# Patient Record
Sex: Male | Born: 1985 | Race: White | Hispanic: No | Marital: Married | Smoking: Never smoker
Health system: Southern US, Community
[De-identification: ages and names within clinical notes are randomized; demographics above are authoritative.]

## PROBLEM LIST (undated history)

## (undated) DIAGNOSIS — Z87898 Personal history of other specified conditions: Secondary | ICD-10-CM

## (undated) DIAGNOSIS — I10 Essential (primary) hypertension: Secondary | ICD-10-CM

## (undated) DIAGNOSIS — Z87442 Personal history of urinary calculi: Secondary | ICD-10-CM

## (undated) DIAGNOSIS — L0390 Cellulitis, unspecified: Secondary | ICD-10-CM

## (undated) HISTORY — PX: KNEE ARTHROTOMY: SHX993

## (undated) HISTORY — PX: URETHRAL DILATION: SUR417

## (undated) HISTORY — DX: Essential (primary) hypertension: I10

---
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
## 2010-08-11 ENCOUNTER — Emergency Department (HOSPITAL_COMMUNITY)
Admission: EM | Admit: 2010-08-11 | Discharge: 2010-08-11 | Payer: Self-pay | Source: Home / Self Care | Admitting: Emergency Medicine

## 2011-01-11 NOTE — Op Note (Signed)
NAME:  Eugene Love, Eugene Love                          ACCOUNT NO.:  [id number]   MEDICAL RECORD NO.:  [id number]                   PATIENT TYPE:  AMB   LOCATION:  DAY                                  FACILITY:  [facility]   PHYSICIAN:  Jene Every, M.D.                 DATE OF BIRTH:  24-Aug-1986   DATE OF PROCEDURE:  08/17/2003  DATE OF DISCHARGE:                                 OPERATIVE REPORT   PREOPERATIVE DIAGNOSES:  Medial meniscus tear bucket-handle right knee.   POSTOPERATIVE DIAGNOSES:  Medial meniscus tear bucket-handle right knee.   PROCEDURE PERFORMED:  Right knee arthroscopy, partial medial meniscectomy.   ANESTHESIA:  General.   ASSISTANT:  None.   BRIEF HISTORY:  A 25 year old with a knee injury and mechanical symptoms  locking, popping and giving way.  MRI indicated a meniscal tear. Operative  intervention was indicated for evaluation and partial medial meniscectomy.  The risks and benefits were discussed including bleeding, infection, injury  to neurovascular structures, recurrent tear, need for repair in the future,  etc.   TECHNIQUE:  The patient in the supine position and after an adequate level  of general anesthesia and 1 gram of Kefzol, the right lower extremity was  prepped and draped in the usual sterile fashion. First a lateral  parapatellar portal was fashioned with a #11 blade in the usual fashion and  a superomedial parapatellar portal was fashioned with a #11 blade. The  ingress cannula atraumatically placed, irrigant was utilized to insufflate  the joint.  Arthroscopic camera then inserted via the lateral portal and  inspection of the suprapatellar pouch revealed normal patellofemoral  tracking, normal patellofemoral sulcus.  Examination of the intercondylar  notch was obscured by a displaced apparent bucket-handle type tear of the  meniscus. An 18 gauge needle was utilized to localize the medial  parapatellar portal which was fashioned with a #11 blade  after localization  with an 18 gauge needle sparing the remnant of the medial meniscus.  We  inserted a probe and inspected the meniscus. This is a fairly significant  tear complex in nature in that it extended from the posterior aspect of the  medial meniscus to the anterior lateral aspect of the meniscus. It, however,  was a complex tear in that it involved the upper portion of the cephalad  portion of the medial meniscus.  There was cleavage plane that tore through  approximately the upper half of the meniscus in the inner one-half.  There  was a partial extension out into the junction of the middle and out to the  junction of the outer third.  The meniscal fragment showed multiple tears  and multiple cleavage planes and it was near truncated in its anterior  margin attached by only a very small stalk as it was posteriorly. There were  multiple cleavage and tear planes in the posterior portion of the meniscus  as  well.  It was felt that this again was not in the fascia and therefore  not a repairable meniscus.  I removed the anterior attachment with a rongeur  and contoured with a 3.5 Kuda shaver.  I removed the meniscal fragment in  multiple pieces due to its multiple complex tearing.  Attached the posterior  attachment of it as well. The remnant meniscus was stable to propalpation  and interestingly along the posterior portion from approximately 2 o'clock  to the 5 o'clock position the cleavage plane was noted. I contoured the  medial edge.  Again 50% of the meniscus remained intact and stable to  propalpation.  I felt that resecting this was not in his best interest as  the remnants did appear to be a stable plane.  Though my concern ultimately  would be that if it tore at this junction, significant portions of meniscus  would need to be removed.  There was one area at this anterior medial  junction that I inserted an 18 gauge needle through the portal and through  the skin over the  medial parapatellar portal just to _________ this area in  hopes that it would stimulate a good vascular response at this one extended  area to allow for perhaps consolidation of that junction to strengthen the  junction between the cleavage plane of the meniscus. I flexed and extended  the knee and there was no impingement of the knee over this for the  remaining meniscal fragment with hopes that this would still provide him  with some cushioning without some cushioning for the condyle in the tibial  plateau.  I then examined the ACL and PCL and they were unremarkable,  examined the lateral compartment, normal femoral condyle, lateral meniscus  and tibial plateau stable to propalpation without evidence of a tear. The  wound was copiously lavaged.  I examined the gutters and they were  unremarkable as well and were palpated posteriorly with no residual  fragments noted.  The knee was copiously flushed.  I closed the portals with  2-0 nylon simple sutures, 0.25% Marcaine with epinephrine and __________.  The wound was dressed sterilely.  He was awoken without difficulty and  transported to the recovery room in satisfactory condition. He had full  range of motion without ___________.  Just prior to full removal, he did  have some grade 1-2 changes in the medial tibial plateau.  There was some  grade 2 changes and some minor grade 3 changes along the femoral condyle.  The wound was dressed sterilely, he was awoken without difficulty and  transported to the recovery room in satisfactory condition.   The patient tolerated the procedure well with no complications.                                               Jene Every, M.D.    Cordelia Pen  D:  08/17/2003  T:  08/17/2003  Job:  [id number]

## 2012-04-27 ENCOUNTER — Encounter (HOSPITAL_COMMUNITY): Payer: Self-pay | Admitting: *Deleted

## 2012-04-27 ENCOUNTER — Emergency Department (HOSPITAL_COMMUNITY)
Admission: EM | Admit: 2012-04-27 | Discharge: 2012-04-27 | Disposition: A | Discharge status: Home / Self Care | Payer: BC Managed Care – PPO | Attending: Emergency Medicine | Admitting: Emergency Medicine

## 2012-04-27 DIAGNOSIS — B369 Superficial mycosis, unspecified: Secondary | ICD-10-CM | POA: Insufficient documentation

## 2012-04-27 DIAGNOSIS — H6240 Otitis externa in other diseases classified elsewhere, unspecified ear: Secondary | ICD-10-CM

## 2012-04-27 NOTE — ED Notes (Signed)
Pt has had an left ear infection since July has had both oral antibiotics and Fungal meds for problem, pain is worse today

## 2012-04-27 NOTE — ED Provider Notes (Signed)
History     CSN: [id number]  Arrival date & time 04/27/12  1548   First MD Initiated Contact with Patient 04/27/12 1616      Chief Complaint  Patient presents with  . Otalgia    (Consider location/radiation/quality/duration/timing/severity/associated sxs/prior treatment) HPI Comments: Patient presents today with a chief complaint of left ear pain.  He states that the pain has been present for the past 2 months.  He was initially seen by his PCP and diagnosed with AOE and given antibiotic drops.  His pain did not improve.  He then followed up with ENT and was diagnosed with a fungal OE.  He has been given oral antifungals and antifungal ear drops.  His pain did not improve.  Three days ago ENT had switched his antifungal to tolnaftate solution, which he has been taking.  He does not feel that his symptoms are improving, but feels that symptoms are worsening.  He denies fever or chills.  No nausea or vomiting.  No surrounding erythema of the ears.  He has not noticed any recent drainage from the ears.  He does think that his hearing is decreased in his left ear.  Patient is a 26 y.o. male presenting with ear pain. The history is provided by the patient.  Otalgia There has been no fever. Pertinent negatives include no ear discharge, no headaches, no sore throat and no neck pain. Associated symptoms comments: Decreased hearing.    History reviewed. No pertinent past medical history.  Past Surgical History  Procedure Date  . Knee arthrotomy     No family history on file.  History  Substance Use Topics  . Smoking status: Never Smoker   . Smokeless tobacco: Not on file  . Alcohol Use: Yes      Review of Systems  Constitutional: Negative for fever and chills.  HENT: Positive for ear pain. Negative for sore throat, trouble swallowing, neck pain, neck stiffness, tinnitus and ear discharge.   Skin: Negative for color change.  Neurological: Negative for headaches.    Allergies    Review of patient's allergies indicates no known allergies.  Home Medications   Current Outpatient Rx  Name Route Sig Dispense Refill  . IBUPROFEN 200 MG PO TABS Oral Take 200 mg by mouth every 6 (six) hours as needed. Pain    . TOLNAFTATE 1 % EX SOLN Topical Apply 1 application topically 2 (two) times daily. 5 gtts to affected ear BID      BP 135/85  Pulse 77  Temp 97.9 F (36.6 C) (Oral)  SpO2 96%  Physical Exam  Nursing note and vitals reviewed. Constitutional: He appears well-developed and well-nourished. No distress.  HENT:  Head: Normocephalic and atraumatic.  Right Ear: Hearing, tympanic membrane, external ear and ear canal normal. No drainage. Tympanic membrane is not erythematous.  Left Ear: External ear normal. No drainage. Tympanic membrane is not erythematous.  Mouth/Throat: Oropharynx is clear and moist.       Fungal infection of the left EAC No mastoid erythema or tenderness  Neck: Normal range of motion. Neck supple.  Cardiovascular: Normal rate, regular rhythm and normal heart sounds.   Pulmonary/Chest: Effort normal and breath sounds normal.  Neurological: He is alert.  Skin: Skin is warm and dry. He is not diaphoretic. No erythema.  Psychiatric: He has a normal mood and affect.    ED Course  Procedures (including critical care time)  Labs Reviewed - No data to display No results found.   No  diagnosis found.    MDM  Patient presents today with ear pain.  He has been evaluated by ENT and diagnosed with a fungal infection of the ear.  He is currently on tolnaftate, but does not feel that it is helping.  Patient is afebrile.  No systemic symptoms.  Therefore, patient instructed to continue taking the medication and follow up with ENT.        Pascal Lux [place], PA-C 04/28/12 1231

## 2012-04-29 NOTE — ED Provider Notes (Signed)
Medical screening examination/treatment/procedure(s) were performed by non-physician practitioner and as supervising physician I was immediately available for consultation/collaboration.    Suha Schoenbeck R Enzio Buchler, MD 04/29/12 1100 

## 2014-04-06 ENCOUNTER — Encounter: Payer: Self-pay | Admitting: Nurse Practitioner

## 2014-04-06 ENCOUNTER — Ambulatory Visit (INDEPENDENT_AMBULATORY_CARE_PROVIDER_SITE_OTHER): Payer: BC Managed Care – PPO | Admitting: Nurse Practitioner

## 2014-04-06 ENCOUNTER — Encounter (INDEPENDENT_AMBULATORY_CARE_PROVIDER_SITE_OTHER): Payer: Self-pay

## 2014-04-06 VITALS — BP 145/92 | HR 83 | Temp 98.0°F | Ht 70.5 in | Wt 249.6 lb

## 2014-04-06 DIAGNOSIS — J209 Acute bronchitis, unspecified: Secondary | ICD-10-CM

## 2014-04-06 MED ORDER — PREDNISONE 20 MG PO TABS: ORAL_TABLET | ORAL | Status: DC

## 2014-04-06 MED ORDER — AZITHROMYCIN 250 MG PO TABS: ORAL_TABLET | ORAL | Status: DC

## 2014-04-06 MED ORDER — HYDROCODONE-HOMATROPINE 5-1.5 MG/5ML PO SYRP: 5.0000 mL | ORAL_SOLUTION | Freq: Three times a day (TID) | ORAL | Status: DC | PRN

## 2014-04-06 NOTE — Patient Instructions (Signed)

## 2014-04-06 NOTE — Progress Notes (Signed)
Subjective:    Patient ID: Eugene BealsRyan Beagley, male    DOB: 06/20/1986, 28 y.o.   MRN: [id number]  HPI Patient in c/o cough that started 2 nights ago- has progressed into a deep cough with sore throat and congestion.    Review of Systems  Constitutional: Positive for fatigue. Negative for fever, chills and appetite change.  HENT: Positive for congestion, ear pain, postnasal drip, rhinorrhea, sinus pressure and sore throat.   Respiratory: Cough: occasionally productive.   Cardiovascular: Negative.   Gastrointestinal: Negative.   Genitourinary: Negative.   Psychiatric/Behavioral: Negative.   All other systems reviewed and are negative.      Objective:   Physical Exam  Constitutional: He is oriented to person, place, and time. He appears well-developed and well-nourished.  HENT:  Right Ear: Hearing, tympanic membrane, external ear and ear canal normal.  Left Ear: Hearing, tympanic membrane, external ear and ear canal normal.  Nose: Mucosal edema and rhinorrhea present. Right sinus exhibits no maxillary sinus tenderness and no frontal sinus tenderness. Left sinus exhibits no maxillary sinus tenderness and no frontal sinus tenderness.  Mouth/Throat: Uvula is midline. Posterior oropharyngeal erythema (mild) present.  Neck: Normal range of motion. No thyromegaly present.  Cardiovascular: Normal rate, regular rhythm and normal heart sounds.   Pulmonary/Chest: Effort normal and breath sounds normal.  Deep tight cough  Abdominal: Soft. Bowel sounds are normal.  Musculoskeletal: Normal range of motion.  Lymphadenopathy:    He has no cervical adenopathy.  Neurological: He is alert and oriented to person, place, and time.  Skin: Skin is warm and dry.  Psychiatric: He has a normal mood and affect. His behavior is normal. Judgment and thought content normal.   BP 145/92  Pulse 83  Temp(Src) 98 F (36.7 C) (Oral)  Ht 5' 10.5" (1.791 m)  Wt 249 lb 9.6 oz (113.218 kg)  BMI 35.30  kg/m2        Assessment & Plan:   1. Acute bronchitis, unspecified organism    Meds ordered this encounter  Medications  . azithromycin (ZITHROMAX Z-PAK) 250 MG tablet    Sig: As directed    Dispense:  6 each    Refill:  0    Order Specific Question:  Supervising Provider    Answer:  Ernestina PennaMOORE, DONALD W [[id number]]  . predniSONE (DELTASONE) 20 MG tablet    Sig: 2 po at same time daily X 5 days    Dispense:  10 tablet    Refill:  0    Order Specific Question:  Supervising Provider    Answer:  Ernestina PennaMOORE, DONALD W [[id number]]  . HYDROcodone-homatropine (HYCODAN) 5-1.5 MG/5ML syrup    Sig: Take 5 mLs by mouth every 8 (eight) hours as needed for cough.    Dispense:  120 mL    Refill:  0    Order Specific Question:  Supervising Provider    Answer:  Ernestina PennaMOORE, DONALD W [[id number]]   1. Take meds as prescribed 2. Use a cool mist humidifier especially during the winter months and when heat has been humid. 3. Use saline nose sprays frequently 4. Saline irrigations of the nose can be very helpful if done frequently.  * 4X daily for 1 week*  * Use of a nettie pot can be helpful with this. Follow directions with this* 5. Drink plenty of fluids 6. Keep thermostat turn down low 7.For any cough or congestion  Use plain Mucinex- regular strength or max strength is fine   * Children- consult with  Pharmacist for dosing 8. For fever or aces or pains- take tylenol or ibuprofen appropriate for age and weight.  * for fevers greater than 101 orally you may alternate ibuprofen and tylenol every  3 hours.   Mary-Margaret Hassell Done, FNP

## 2014-04-19 ENCOUNTER — Telehealth: Payer: Self-pay | Admitting: Family Medicine

## 2014-04-19 NOTE — Telephone Encounter (Signed)
Pt notified and will try OTC med and will call back if no improvement.

## 2014-04-19 NOTE — Telephone Encounter (Signed)
Not much else can do- force fluids and try delsym OTC- NTBS if no better

## 2014-05-07 ENCOUNTER — Encounter: Payer: Self-pay | Admitting: Nurse Practitioner

## 2014-05-07 ENCOUNTER — Ambulatory Visit (INDEPENDENT_AMBULATORY_CARE_PROVIDER_SITE_OTHER): Payer: BC Managed Care – PPO | Admitting: Nurse Practitioner

## 2014-05-07 VITALS — BP 121/86 | HR 67 | Temp 97.6°F | Ht 70.5 in | Wt 247.6 lb

## 2014-05-07 DIAGNOSIS — J209 Acute bronchitis, unspecified: Secondary | ICD-10-CM

## 2014-05-07 MED ORDER — METHYLPREDNISOLONE ACETATE 80 MG/ML IJ SUSP
80.0000 mg | Freq: Once | INTRAMUSCULAR | Status: AC
  Administered 2014-05-07: 80 mg via INTRAMUSCULAR

## 2014-05-07 MED ORDER — AMOXICILLIN 875 MG PO TABS: 875.0000 mg | ORAL_TABLET | Freq: Two times a day (BID) | ORAL | Status: DC

## 2014-05-07 NOTE — Patient Instructions (Signed)

## 2014-05-07 NOTE — Progress Notes (Signed)
   Subjective:    Patient ID: Eugene Love, male    DOB: 05-10-1986, 28 y.o.   MRN: [id number]  HPI Patient in c/o cough and congestion- was seen 1 month ago with same symptoms that never completely resolved. Cough has gotten worsed- mild congestion.    Review of Systems  Constitutional: Negative for fever, chills and appetite change.  HENT: Positive for congestion, rhinorrhea and sore throat (in mornings only).   Respiratory: Positive for cough.   Cardiovascular: Negative.   Neurological: Negative.   Psychiatric/Behavioral: Negative.   All other systems reviewed and are negative.      Objective:   Physical Exam  Constitutional: He is oriented to person, place, and time. He appears well-developed and well-nourished. No distress.  HENT:  Right Ear: Hearing, tympanic membrane, external ear and ear canal normal.  Left Ear: Hearing, tympanic membrane, external ear and ear canal normal.  Nose: Mucosal edema and rhinorrhea present. Right sinus exhibits no maxillary sinus tenderness and no frontal sinus tenderness. Left sinus exhibits no maxillary sinus tenderness and no frontal sinus tenderness.  Mouth/Throat: Uvula is midline, oropharynx is clear and moist and mucous membranes are normal.  Eyes: Pupils are equal, round, and reactive to light.  Neck: Normal range of motion. Neck supple.  Cardiovascular: Normal rate, regular rhythm and normal heart sounds.   Pulmonary/Chest: Effort normal and breath sounds normal.  Deep dry cough  Neurological: He is alert and oriented to person, place, and time.  Skin: Skin is warm and dry.  Psychiatric: He has a normal mood and affect. His behavior is normal. Judgment and thought content normal.    BP 121/86  Pulse 67  Temp(Src) 97.6 F (36.4 C) (Oral)  Ht 5' 10.5" (1.791 m)  Wt 247 lb 9.6 oz (112.311 kg)  BMI 35.01 kg/m2       Assessment & Plan:   1. Acute bronchitis, unspecified organism    Meds ordered this encounter  Medications  .  amoxicillin (AMOXIL) 875 MG tablet    Sig: Take 1 tablet (875 mg total) by mouth 2 (two) times daily.    Dispense:  20 tablet    Refill:  0    Order Specific Question:  Supervising Provider    Answer:  Ernestina Penna [[id number]]  . methylPREDNISolone acetate (DEPO-MEDROL) injection 80 mg    Sig:    1. Take meds as prescribed 2. Use a cool mist humidifier especially during the winter months and when heat has been humid. 3. Use saline nose sprays frequently 4. Saline irrigations of the nose can be very helpful if done frequently.  * 4X daily for 1 week*  * Use of a nettie pot can be helpful with this. Follow directions with this* 5. Drink plenty of fluids 6. Keep thermostat turn down low 7.For any cough or congestion  Use plain Mucinex- regular strength or max strength is fine   * Children- consult with Pharmacist for dosing 8. For fever or aces or pains- take tylenol or ibuprofen appropriate for age and weight.  * for fevers greater than 101 orally you may alternate ibuprofen and tylenol every  3 hours.   Mary-Margaret Daphine Deutscher, FNP

## 2015-02-16 ENCOUNTER — Ambulatory Visit (INDEPENDENT_AMBULATORY_CARE_PROVIDER_SITE_OTHER): Payer: BC Managed Care – PPO | Admitting: Otolaryngology

## 2015-02-16 DIAGNOSIS — H6121 Impacted cerumen, right ear: Secondary | ICD-10-CM

## 2015-02-16 DIAGNOSIS — H60331 Swimmer's ear, right ear: Secondary | ICD-10-CM | POA: Diagnosis not present

## 2015-08-05 ENCOUNTER — Ambulatory Visit (INDEPENDENT_AMBULATORY_CARE_PROVIDER_SITE_OTHER): Payer: BC Managed Care – PPO | Admitting: Nurse Practitioner

## 2015-08-05 VITALS — BP 135/82 | HR 108 | Temp 98.8°F | Ht 70.5 in | Wt 263.4 lb

## 2015-08-05 DIAGNOSIS — J069 Acute upper respiratory infection, unspecified: Secondary | ICD-10-CM

## 2015-08-05 DIAGNOSIS — K122 Cellulitis and abscess of mouth: Secondary | ICD-10-CM | POA: Diagnosis not present

## 2015-08-05 DIAGNOSIS — J029 Acute pharyngitis, unspecified: Secondary | ICD-10-CM | POA: Diagnosis not present

## 2015-08-05 LAB — POCT RAPID STREP A (OFFICE): Rapid Strep A Screen: NEGATIVE

## 2015-08-05 MED ORDER — AMOXICILLIN 875 MG PO TABS: 875.0000 mg | ORAL_TABLET | Freq: Two times a day (BID) | ORAL | Status: DC

## 2015-08-05 MED ORDER — METHYLPREDNISOLONE ACETATE 80 MG/ML IJ SUSP
80.0000 mg | Freq: Once | INTRAMUSCULAR | Status: AC
  Administered 2015-08-05: 80 mg via INTRAMUSCULAR

## 2015-08-05 NOTE — Progress Notes (Signed)
  Subjective:     Eugene Love is a 29 y.o. male who presents for evaluation of sore throat. Symptoms include: congestion, cough, nasal congestion, sneezing and sore throat. Onset of symptoms was 3 days ago. Symptoms have been gradually worsening since that time. Past history is significant for no history of pneumonia or bronchitis. Patient is a non-smoker.  The following portions of the patient's history were reviewed and updated as appropriate: allergies, current medications, past family history, past medical history, past social history, past surgical history and problem list.  Review of Systems Pertinent items are noted in HPI.   Objective:    General appearance: alert and cooperative Eyes: conjunctivae/corneas clear. PERRL, EOM's intact. Fundi benign. Ears: normal TM's and external ear canals both ears Nose: clear discharge, moderate congestion, turbinates red, no sinus tenderness Throat: lips, mucosa, and tongue normal; teeth and gums normal and post oral pharynx erythematous with moderate inflammaton of uvula Neck: no adenopathy, no carotid bruit, no JVD, supple, symmetrical, trachea midline and thyroid not enlarged, symmetric, no tenderness/mass/nodules Lungs: clear to auscultation bilaterally Heart: regular rate and rhythm, S1, S2 normal, no murmur, click, rub or gallop   Results for orders placed or performed in visit on 08/05/15  POCT rapid strep A  Result Value Ref Range   Rapid Strep A Screen Negative Negative        Assessment:    Acute resp infection with cough and uvulitis  Plan:  1. Sore throat - POCT rapid strep A  2. Upper respiratory tract infection 1. Take meds as prescribed 2. Use a cool mist humidifier especially during the winter months and when heat has been humid. 3. Use saline nose sprays frequently 4. Saline irrigations of the nose can be very helpful if done frequently.  * 4X daily for 1 week*  * Use of a nettie pot can be helpful with this.  Follow directions with this* 5. Drink plenty of fluids 6. Keep thermostat turn down low 7.For any cough or congestion  Use plain Mucinex- regular strength or max strength is fine   * Children- consult with Pharmacist for dosing 8. For fever or aces or pains- take tylenol or ibuprofen appropriate for age and weight.  * for fevers greater than 101 orally you may alternate ibuprofen and tylenol every  3 hours.    - amoxicillin (AMOXIL) 875 MG tablet; Take 1 tablet (875 mg total) by mouth 2 (two) times daily. 1 po BID  Dispense: 20 tablet; Refill: 0  3. Uvulitis Force fluids Motrin or tylenol OTC OTC decongestant Throat lozenges if help New toothbrush in 3 days - methylPREDNISolone acetate (DEPO-MEDROL) injection 80 mg; Inject 1 mL (80 mg total) into the muscle once.  Eugene Daphine DeutscherMartin, FNP

## 2015-08-05 NOTE — Patient Instructions (Signed)

## 2015-08-29 ENCOUNTER — Telehealth: Payer: Self-pay | Admitting: Nurse Practitioner

## 2015-08-29 NOTE — Telephone Encounter (Signed)
denied °

## 2016-04-18 ENCOUNTER — Other Ambulatory Visit: Payer: Self-pay | Admitting: *Deleted

## 2016-04-18 NOTE — Telephone Encounter (Signed)
Last filled 02/20/16. Route to Pool A, so they can call med in

## 2016-04-19 MED ORDER — CLONAZEPAM 0.5 MG PO TABS: 0.5000 mg | ORAL_TABLET | Freq: Every evening | ORAL | 5 refills | Status: DC | PRN

## 2016-04-19 NOTE — Telephone Encounter (Signed)
rx called into pharmacy

## 2016-04-19 NOTE — Telephone Encounter (Signed)
I completed the fill, okay to call in

## 2016-04-23 ENCOUNTER — Telehealth: Payer: Self-pay | Admitting: Nurse Practitioner

## 2016-05-24 ENCOUNTER — Other Ambulatory Visit: Payer: Self-pay | Admitting: *Deleted

## 2016-05-24 ENCOUNTER — Other Ambulatory Visit: Payer: Self-pay | Admitting: Physician Assistant

## 2016-05-24 MED ORDER — CLONAZEPAM 0.5 MG PO TABS: 0.5000 mg | ORAL_TABLET | Freq: Every evening | ORAL | 3 refills | Status: DC | PRN

## 2016-05-24 NOTE — Telephone Encounter (Signed)
Rx called in to pharmacy. 

## 2016-06-18 ENCOUNTER — Ambulatory Visit (INDEPENDENT_AMBULATORY_CARE_PROVIDER_SITE_OTHER): Payer: BC Managed Care – PPO | Admitting: *Deleted

## 2016-06-18 DIAGNOSIS — Z23 Encounter for immunization: Secondary | ICD-10-CM | POA: Diagnosis not present

## 2016-06-18 NOTE — Progress Notes (Signed)
Pt given Tdap Tolerated well 

## 2016-07-05 ENCOUNTER — Ambulatory Visit (INDEPENDENT_AMBULATORY_CARE_PROVIDER_SITE_OTHER): Payer: BC Managed Care – PPO | Admitting: Pediatrics

## 2016-07-05 ENCOUNTER — Encounter: Payer: Self-pay | Admitting: Pediatrics

## 2016-07-05 VITALS — BP 126/82 | HR 79 | Temp 97.8°F | Ht 70.5 in | Wt 260.0 lb

## 2016-07-05 DIAGNOSIS — L03818 Cellulitis of other sites: Secondary | ICD-10-CM | POA: Diagnosis not present

## 2016-07-05 MED ORDER — DOXYCYCLINE HYCLATE 100 MG PO TABS: 100.0000 mg | ORAL_TABLET | Freq: Two times a day (BID) | ORAL | 0 refills | Status: DC

## 2016-07-05 NOTE — Patient Instructions (Signed)
Warm/cool compresses Ibuprofen 400-600mg  three times a day

## 2016-07-05 NOTE — Progress Notes (Signed)
  Subjective:   Patient ID: Eugene Love, male    DOB: 01/15/1986, 30 y.o.   MRN: [id number] CC: Recurrent Skin Infections (left side groin area)  HPI: Eugene Love is a 30 y.o. male presenting for Recurrent Skin Infections (left side groin area)  Yesterday area felt sore Today still hurts Notices it all the time when walking No draining at all No fevers Some redness around area   Relevant past medical, surgical, family and social history reviewed. Allergies and medications reviewed and updated. History  Smoking Status  . Never Smoker  Smokeless Tobacco  . Current User  . Types: Snuff   ROS: Per HPI   Objective:    BP 126/82   Pulse 79   Temp 97.8 F (36.6 C) (Oral)   Ht 5' 10.5" (1.791 m)   Wt 260 lb (117.9 kg)   BMI 36.78 kg/m   Wt Readings from Last 3 Encounters:  07/05/16 260 lb (117.9 kg)  08/05/15 263 lb 6.4 oz (119.5 kg)  05/07/14 247 lb 9.6 oz (112.3 kg)    Gen: NAD, alert, cooperative with exam, NCAT EYES: EOMI, no conjunctival injection, or no icterus CV: NRRR, normal S1/S2, no murmur, distal pulses 2+ b/l Resp: CTABL, no wheezes, normal WOB Skin: apprx 11-12cm of redness L inner thigh. 1 cm area of soft nodularity under the skin in center of redness, no fluctuance. TTP. No discharge.  Assessment & Plan:  Eugene Love was seen today for cellulitis. No fluctuance Mildly tender Start abx as below No draining now, warm compresses, NSAIDs for pain prn Draw line around area, will let me know if not improving  Diagnoses and all orders for this visit:  Cellulitis of other specified site -     doxycycline (VIBRA-TABS) 100 MG tablet; Take 1 tablet (100 mg total) by mouth 2 (two) times daily.   Follow up plan: prn Rex Krasarol Harvin Konicek, MD [facility]

## 2016-10-02 ENCOUNTER — Other Ambulatory Visit: Payer: Self-pay | Admitting: Physician Assistant

## 2016-10-04 NOTE — Telephone Encounter (Signed)
Please call in clonazepam with 1 refills 

## 2016-11-05 ENCOUNTER — Ambulatory Visit (INDEPENDENT_AMBULATORY_CARE_PROVIDER_SITE_OTHER): Payer: BC Managed Care – PPO | Admitting: Family Medicine

## 2016-11-05 ENCOUNTER — Encounter: Payer: Self-pay | Admitting: Family Medicine

## 2016-11-05 VITALS — BP 132/89 | HR 80 | Temp 97.9°F | Ht 70.5 in | Wt 268.0 lb

## 2016-11-05 DIAGNOSIS — J029 Acute pharyngitis, unspecified: Secondary | ICD-10-CM | POA: Diagnosis not present

## 2016-11-05 LAB — CULTURE, GROUP A STREP

## 2016-11-05 LAB — RAPID STREP SCREEN (MED CTR MEBANE ONLY): STREP GP A AG, IA W/REFLEX: NEGATIVE

## 2016-11-05 NOTE — Progress Notes (Signed)
   Subjective:  Patient ID: Eugene Love, male    DOB: 07/20/1986  Age: 31 y.o. MRN: [id number]  CC: Sore Throat (x 3 days) and Eugene BealsCough   HPI Eugene Love presents for Patient presents with upper respiratory congestion. Rhinorrhea that is clear. There is moderate sore throat. Patient reports coughing frequently as well.  No sputum noted. There is no fever, chills or sweats. The patient denies being short of breath. Onset was 3-5 days ago. Gradually worsening. Tried OTCs without improvement. History Eugene Love has a past medical history of Hypertension.   He has a past surgical history that includes Knee Arthrotomy.   His family history is not on file.He reports that he has never smoked. His smokeless tobacco use includes Snuff. He reports that he drinks alcohol. He reports that he does not use drugs.  Current Outpatient Prescriptions on File Prior to Visit  Medication Sig Dispense Refill  . clonazePAM (KLONOPIN) 0.5 MG tablet TAKE ONE TABLET BY MOUTH AT BEDTIME AS NEEDED FOR SLEEP 30 tablet 1  . lisinopril (PRINIVIL,ZESTRIL) 5 MG tablet TAKE ONE (1) TABLET EACH DAY 30 tablet 6   No current facility-administered medications on file prior to visit.     ROS Review of Systems  Constitutional: Negative for activity change, appetite change, chills and fever.  HENT: Positive for congestion, postnasal drip, rhinorrhea and sinus pressure. Negative for ear discharge, ear pain, hearing loss, nosebleeds, sneezing and trouble swallowing.   Respiratory: Positive for cough. Negative for chest tightness and shortness of breath.   Cardiovascular: Negative for chest pain and palpitations.  Skin: Negative for rash.    Objective:  BP 132/89   Pulse 80   Temp 97.9 F (36.6 C) (Oral)   Ht 5' 10.5" (1.791 m)   Wt 268 lb (121.6 kg)   BMI 37.91 kg/m   Physical Exam  Constitutional: He appears well-developed and well-nourished.  HENT:  Head: Normocephalic and atraumatic.  Right Ear: Tympanic membrane and  external ear normal. No decreased hearing is noted.  Left Ear: Tympanic membrane and external ear normal. No decreased hearing is noted.  Nose: Mucosal edema present. Right sinus exhibits no frontal sinus tenderness. Left sinus exhibits no frontal sinus tenderness.  Mouth/Throat: No oropharyngeal exudate or posterior oropharyngeal erythema.  Neck: No Brudzinski's sign noted.  Pulmonary/Chest: Breath sounds normal. No respiratory distress.  Lymphadenopathy:       Head (right side): No preauricular adenopathy present.       Head (left side): No preauricular adenopathy present.       Right cervical: No superficial cervical adenopathy present.      Left cervical: No superficial cervical adenopathy present.    Assessment & Plan:   Eugene Love was seen today for sore throat and cough.  Diagnoses and all orders for this visit:  Sore throat -     Rapid strep screen (not at [facility])  Other orders -     Culture, Group A Strep   I have discontinued Eugene Love's doxycycline. I am also having him maintain his lisinopril and clonazePAM.  No orders of the defined types were placed in this encounter.    Follow-up: Return if symptoms worsen or fail to improve.  Mechele ClaudeWarren Shadie Sweatman, M.D.

## 2016-12-18 ENCOUNTER — Ambulatory Visit (INDEPENDENT_AMBULATORY_CARE_PROVIDER_SITE_OTHER): Payer: BC Managed Care – PPO | Admitting: Family Medicine

## 2016-12-18 ENCOUNTER — Ambulatory Visit (INDEPENDENT_AMBULATORY_CARE_PROVIDER_SITE_OTHER): Payer: BC Managed Care – PPO

## 2016-12-18 ENCOUNTER — Encounter: Payer: Self-pay | Admitting: Family Medicine

## 2016-12-18 VITALS — BP 139/92 | HR 86 | Temp 98.8°F | Ht 70.5 in | Wt 273.8 lb

## 2016-12-18 DIAGNOSIS — R109 Unspecified abdominal pain: Secondary | ICD-10-CM

## 2016-12-18 LAB — MICROSCOPIC EXAMINATION
Bacteria, UA: NONE SEEN
RENAL EPITHEL UA: NONE SEEN /HPF
WBC UA: NONE SEEN /HPF (ref 0–?)

## 2016-12-18 LAB — URINALYSIS, COMPLETE
BILIRUBIN UA: NEGATIVE
Glucose, UA: NEGATIVE
Ketones, UA: NEGATIVE
LEUKOCYTES UA: NEGATIVE
Nitrite, UA: NEGATIVE
PH UA: 5.5 (ref 5.0–7.5)
Specific Gravity, UA: 1.025 (ref 1.005–1.030)
Urobilinogen, Ur: 0.2 mg/dL (ref 0.2–1.0)

## 2016-12-18 MED ORDER — KETOROLAC TROMETHAMINE 60 MG/2ML IM SOLN
60.0000 mg | Freq: Once | INTRAMUSCULAR | Status: AC
  Administered 2016-12-18: 60 mg via INTRAMUSCULAR

## 2016-12-18 MED ORDER — OXYCODONE-ACETAMINOPHEN 5-325 MG PO TABS: 1.0000 | ORAL_TABLET | Freq: Three times a day (TID) | ORAL | 0 refills | Status: DC | PRN

## 2016-12-18 MED ORDER — TAMSULOSIN HCL 0.4 MG PO CAPS: 0.4000 mg | ORAL_CAPSULE | Freq: Every day | ORAL | 3 refills | Status: DC

## 2016-12-18 NOTE — Progress Notes (Signed)
     HPI  Patient presents today with pain characteristic of kidney stone.  Patient explains that he had acute onset right flank pain at 8:30 AM, he then stated that within the next 15 minutes it became obvious that he had a kidney stone and needed to leave work.  He describes severe right-sided constant flank pain radiating to the right groin.  He has a history of kidney stones characteristic pain.   PMH: Smoking status noted ROS: Per HPI  Objective: BP (!) 139/92 (BP Location: Left Arm, Patient Position: Sitting, Cuff Size: Large)   Pulse 86   Temp 98.8 F (37.1 C) (Oral)   Ht 5' 10.5" (1.791 m)   Wt 273 lb 12.8 oz (124.2 kg)   BMI 38.73 kg/m  Gen: NAD, alert, cooperative with exam HEENT: NCAT CV: RRR, good S1/S2, no murmur Resp: CTABL, no wheezes, non-labored Ext: No edema, warm Neuro: Alert and oriented, No gross deficits  Plain film - abdomen Moderate stool burden, right-sided renal stone likely.  Assessment and plan:  # Right flank pain Likely renal stone, urine with 3+ blood Street with Flomax plus Percocet Also CT renal stone study, refer to urology if too large to pass- canceled CT, radiology report measures 0.5 cm stone nonobstructing.   60 mg IM Toradol given    Orders Placed This Encounter  Procedures  . DG Abd 1 View    Standing Status:   Future    Number of Occurrences:   1    Standing Expiration Date:   02/17/2018    Order Specific Question:   Reason for Exam (SYMPTOM  OR DIAGNOSIS REQUIRED)    Answer:   R flank pain    Order Specific Question:   Preferred imaging location?    Answer:   Internal  . Urinalysis, Complete    Meds ordered this encounter  Medications  . tamsulosin (FLOMAX) 0.4 MG CAPS capsule    Sig: Take 1 capsule (0.4 mg total) by mouth daily.    Dispense:  30 capsule    Refill:  3  . oxyCODONE-acetaminophen (ROXICET) 5-325 MG tablet    Sig: Take 1 tablet by mouth every 8 (eight) hours as needed for severe pain.   Dispense:  30 tablet    Refill:  0  . ketorolac (TORADOL) injection 60 mg    Murtis Sink, MD [place] 12/18/2016, 10:15 AM

## 2016-12-18 NOTE — Patient Instructions (Signed)
Great to see you!   Kidney Stones Kidney stones (urolithiasis) are rock-like masses that form inside of the kidneys. Kidneys are organs that make pee (urine). A kidney stone can cause very bad pain and can block the flow of pee. The stone usually leaves your body (passes) through your pee. You may need to have a doctor take out the stone. Follow these instructions at home: Eating and drinking  Drink enough fluid to keep your pee clear or pale yellow. This will help you pass the stone.  If told by your doctor, change the foods you eat (your diet). This may include: ? Limiting how much salt (sodium) you eat. ? Eating more fruits and vegetables. ? Limiting how much meat, poultry, fish, and eggs you eat.  Follow instructions from your doctor about eating or drinking restrictions. General instructions  Collect pee samples as told by your doctor. You may need to collect a pee sample: ? 24 hours after a stone comes out. ? 8-12 weeks after a stone comes out, and every 6-12 months after that.  Strain your pee every time you pee (urinate), for as long as told. Use the strainer that your doctor recommends.  Do not throw out the stone. Keep it so that it can be tested by your doctor.  Take over-the-counter and prescription medicines only as told by your doctor.  Keep all follow-up visits as told by your doctor. This is important. You may need follow-up tests. Preventing kidney stones To prevent another kidney stone:  Drink enough fluid to keep your pee clear or pale yellow. This is the best way to prevent kidney stones.  Eat healthy foods.  Avoid certain foods as told by your doctor. You may be told to eat less protein.  Stay at a healthy weight.  Contact a doctor if:  You have pain that gets worse or does not get better with medicine. Get help right away if:  You have a fever or chills.  You get very bad pain.  You get new pain in your belly (abdomen).  You pass out  (faint).  You cannot pee. This information is not intended to replace advice given to you by your health care provider. Make sure you discuss any questions you have with your health care provider. Document Released: 01/29/2008 Document Revised: 04/30/2016 Document Reviewed: 04/30/2016 Elsevier Interactive Patient Education  2017 [employer]  

## 2016-12-20 ENCOUNTER — Other Ambulatory Visit: Payer: Self-pay | Admitting: Physician Assistant

## 2016-12-20 ENCOUNTER — Telehealth: Payer: Self-pay | Admitting: Family Medicine

## 2016-12-20 ENCOUNTER — Other Ambulatory Visit: Payer: Self-pay | Admitting: Nurse Practitioner

## 2016-12-20 DIAGNOSIS — N200 Calculus of kidney: Secondary | ICD-10-CM

## 2016-12-20 NOTE — Telephone Encounter (Signed)
Pt called - per Ermalinda Memos and A jones We will do urgent referral to urology  He is aware to go to ER over the weekend if needed.

## 2016-12-23 NOTE — Telephone Encounter (Signed)
Please call in klonopin with 1 refills 

## 2016-12-30 ENCOUNTER — Other Ambulatory Visit: Payer: Self-pay | Admitting: Urology

## 2017-01-02 NOTE — Patient Instructions (Signed)
Eugene BealsRyan Love  01/02/2017   Your procedure is scheduled on: 01-06-17  [facility] to 3rd floor to  Short Stay Center at 1:45PM.   Call this number if you have problems the morning of surgery [phone]    Remember: ONLY 1 PERSON MAY GO WITH YOU TO SHORT STAY TO GET  READY MORNING OF YOUR SURGERY.  Do not eat food After Midnight. You may have clear liquids from midnight until 945am day of surgery. Nothing by mouth after 945am!!      Take these medicines the morning of surgery with A SIP OF WATER: oxycodone as needed                                You may not have any metal on your body including hair pins and              piercings  Do not wear jewelry, make-up, lotions, powders or perfumes, deodorant                     Men may shave face and neck.   Do not bring valuables to the hospital. [facility] IS NOT             RESPONSIBLE   FOR VALUABLES.  Contacts, dentures or bridgework may not be worn into surgery.      Patients discharged the day of surgery will not be allowed to drive home.  Name and phone number of your driver:  Special Instructions: N/A              Please read over the following fact sheets you were given: _____________________________________________________________________   CLEAR LIQUID DIET   Foods Allowed                                                                     Foods Excluded  Coffee and tea, regular and decaf                             liquids that you cannot  Plain Jell-O in any flavor                                             see through such as: Fruit ices (not with fruit pulp)                                     milk, soups, orange juice  Iced Popsicles                                    All solid food Carbonated beverages, regular and diet  Cranberry, grape and apple juices Sports drinks like Gatorade Lightly seasoned clear  broth or consume(fat free) Sugar, honey syrup  Sample Menu Breakfast                                Lunch                                     Supper Cranberry juice                    Beef broth                            Chicken broth Jell-O                                     Grape juice                           Apple juice Coffee or tea                        Jell-O                                      Popsicle                                                Coffee or tea                        Coffee or tea  _____________________________________________________________________  [facility] - Preparing for Surgery Before surgery, you can play an important role.  Because skin is not sterile, your skin needs to be as free of germs as possible.  You can reduce the number of germs on your skin by washing with CHG (chlorahexidine gluconate) soap before surgery.  CHG is an antiseptic cleaner which kills germs and bonds with the skin to continue killing germs even after washing. Please DO NOT use if you have an allergy to CHG or antibacterial soaps.  If your skin becomes reddened/irritated stop using the CHG and inform your nurse when you arrive at Short Stay. Do not shave (including legs and underarms) for at least 48 hours prior to the first CHG shower.  You may shave your face/neck. Please follow these instructions carefully:  1.  Shower with CHG Soap the night before surgery and the  morning of Surgery.  2.  If you choose to wash your hair, wash your hair first as usual with your  normal  shampoo.  3.  After you shampoo, rinse your hair and body thoroughly to remove the  shampoo.                           4.  Use CHG as you would any other liquid soap.  You can apply chg directly  to the skin and wash  Gently with a scrungie or clean washcloth.  5.  Apply the CHG Soap to your body ONLY FROM THE NECK DOWN.   Do not use on face/ open                           Wound or open  sores. Avoid contact with eyes, ears mouth and genitals (private parts).                       Wash face,  Genitals (private parts) with your normal soap.             6.  Wash thoroughly, paying special attention to the area where your surgery  will be performed.  7.  Thoroughly rinse your body with warm water from the neck down.  8.  DO NOT shower/wash with your normal soap after using and rinsing off  the CHG Soap.                9.  Pat yourself dry with a clean towel.            10.  Wear clean pajamas.            11.  Place clean sheets on your bed the night of your first shower and do not  sleep with pets. Day of Surgery : Do not apply any lotions/deodorants the morning of surgery.  Please wear clean clothes to the hospital/surgery center.  FAILURE TO FOLLOW THESE INSTRUCTIONS MAY RESULT IN THE CANCELLATION OF YOUR SURGERY PATIENT SIGNATURE_________________________________  NURSE SIGNATURE__________________________________  ________________________________________________________________________

## 2017-01-03 ENCOUNTER — Encounter (HOSPITAL_COMMUNITY): Payer: Self-pay

## 2017-01-03 ENCOUNTER — Encounter (HOSPITAL_COMMUNITY)
Admission: RE | Admit: 2017-01-03 | Discharge: 2017-01-03 | Disposition: A | Discharge status: Home / Self Care | Payer: BC Managed Care – PPO | Source: Ambulatory Visit | Attending: Urology | Admitting: Urology

## 2017-01-03 DIAGNOSIS — I10 Essential (primary) hypertension: Secondary | ICD-10-CM | POA: Diagnosis not present

## 2017-01-03 DIAGNOSIS — N202 Calculus of kidney with calculus of ureter: Secondary | ICD-10-CM | POA: Diagnosis not present

## 2017-01-03 DIAGNOSIS — Z79899 Other long term (current) drug therapy: Secondary | ICD-10-CM | POA: Diagnosis not present

## 2017-01-03 HISTORY — DX: Cellulitis, unspecified: L03.90

## 2017-01-03 HISTORY — DX: Personal history of other specified conditions: Z87.898

## 2017-01-03 LAB — CBC
HCT: 42.8 % (ref 39.0–52.0)
HEMOGLOBIN: 14.1 g/dL (ref 13.0–17.0)
MCH: 28.8 pg (ref 26.0–34.0)
MCHC: 32.9 g/dL (ref 30.0–36.0)
MCV: 87.3 fL (ref 78.0–100.0)
PLATELETS: 277 10*3/uL (ref 150–400)
RBC: 4.9 MIL/uL (ref 4.22–5.81)
RDW: 11.7 % (ref 11.5–15.5)
WBC: 6.7 10*3/uL (ref 4.0–10.5)

## 2017-01-03 NOTE — H&P (Signed)
Office Visit Report     12/26/2016     --------------------------------------------------------------------------------     Eugene Love   MRN: [id number]  PRIMARY CARE:  Kevin Fenton, MD   DOB: November 22, 1985, 31 year old Male  REFERRING:  Kevin Fenton, MD   SSN: -**[id number]  PROVIDER:  Heloise Purpura, M.D.     LOCATION:  [facility] [phone]     --------------------------------------------------------------------------------     CC/HPI: Right flank pain/kidney stone     Eugene Love is a 31 year old gentleman seen today at the request of Dr. Kevin Fenton for right-sided flank pain and a probable right ureteral calculus. He has a long-standing history of kidney stones having spontaneously passed approximately 6 stones over his lifetime. He is never required surgical intervention. He has a very strong family history of kidney stones with both parents and multiple grandparents who have had kidney stones.     He developed the acute onset of severe right-sided flank pain one week ago. This gradually progressed and became associated with nausea and vomiting. He also had an episode of diarrhea that day which has not persisted. He has continued to have intermittent flank pain over the past week and now is having associated urinary frequency and urgency. He was seen by his primary care physician on 12/18/16. The urinalysis demonstrated greater than 30 red blood cells per high-powered field. He had a KUB x-ray performed that confirmed a 5 mm nonobstructing right lower pole calculus. No obvious ureteral calculus was identified. His primary care physician attempted to get a stone protocol CT scan performed but was apparently denied by his insurance company. Currently, he has been taking tamsulosin. He has not noted passing a stone. His last pain episode was within the last 48 hours. He has been using Percocet to control his pain.        ALLERGIES: None     MEDICATIONS: Lisinopril    Clonazepam   Flomax 0.4 mg capsule, ext release 24 hr        GU PSH: None     NON-GU PSH: Knee Arthroscopy/surgery       GU PMH: None     NON-GU PMH: Hypertension       FAMILY HISTORY: 1 son - Runs in Family     SOCIAL HISTORY: Marital Status: Married  Current Smoking Status: Patient has never smoked.     Tobacco Use Assessment Completed: Used Tobacco in last 30 days?  Does drink.   Drinks 3 caffeinated drinks per day.       REVIEW OF SYSTEMS:     GU Review Male:   Patient reports frequent urination, hard to postpone urination, burning/ pain with urination, get up at night to urinate, stream starts and stops, trouble starting your streams, and have to strain to urinate . Patient denies leakage of urine.   Gastrointestinal (Upper):   Patient reports nausea and vomiting.    Gastrointestinal (Lower):   Patient denies constipation and diarrhea.   Constitutional:   Patient denies fever, night sweats, weight loss, and fatigue.   Skin:   Patient denies skin rash/ lesion and itching.   Eyes:   Patient denies blurred vision and double vision.   Ears/ Nose/ Throat:   Patient denies sore throat and sinus problems.   Hematologic/Lymphatic:   Patient denies swollen glands and easy bruising.   Cardiovascular:   Patient denies leg swelling and chest pains.   Respiratory:   Patient denies cough and shortness of  breath.   Endocrine:   Patient denies excessive thirst.   Musculoskeletal:   Patient reports back pain. Patient denies joint pain.   Neurological:   Patient denies headaches and dizziness.   Psychologic:   Patient denies depression and anxiety.     VITAL SIGNS:       12/26/2016 08:05 AM   Weight 270 lb / 122.47 kg   Height 71 in / 180.34 cm   BP 136/85 mmHg   Pulse 79 /min   Temperature 98.0 F / 37 C   BMI 37.7 kg/m     MULTI-SYSTEM PHYSICAL EXAMINATION:     Constitutional: Well-nourished. No physical deformities. Normally developed. Good grooming.   Neck: Neck  symmetrical, not swollen. Normal tracheal position.   Respiratory: No labored breathing, no use of accessory muscles. Clear bilaterally.   Cardiovascular: Normal temperature, normal extremity pulses, no swelling, no varicosities. Regular rate and rhythm.   Lymphatic: No enlargement of neck, axillae, groin.   Skin: No paleness, no jaundice, no cyanosis. No lesion, no ulcer, no rash.   Neurologic / Psychiatric: Oriented to time, oriented to place, oriented to person. No depression, no anxiety, no agitation.   Gastrointestinal: No mass, no tenderness, no rigidity, non obese abdomen.   Eyes: Normal conjunctivae. Normal eyelids.   Ears, Nose, Mouth, and Throat: Left ear no scars, no lesions, no masses. Right ear no scars, no lesions, no masses. Nose no scars, no lesions, no masses. Normal hearing. Normal lips.   Musculoskeletal: Normal gait and station of head and neck.        PAST DATA REVIEWED:   Source Of History:  Patient   Records Review:   Previous Patient Records   Urine Test Review:   Urinalysis   X-Ray Review: Outside X-Ray: Reviewed Films. I reviewed his KUB x-ray from 4/25. This does demonstrate calcification that appears in the lower pole of the right kidney and in a nonobstructing location measuring approximately 5 mm. This likely represents his calculus in the mid right ureter on today's CT scan as no persistent renal calculi are noted on his CT today.  C.T. Stone Protocol: Reviewed Films. I reviewed his CT scan. He has 2 ureteral calculi including a 6 mm mid right ureteral calculus and a 7 mm right UVJ calculus.       PROCEDURES:          C.T. Urogram - [id number]                     Urinalysis - 81003  Dipstick Dipstick Cont'd   Color: Yellow Bilirubin: Neg   Appearance: Clear Ketones: Neg   Specific Gravity: 1.015 Blood: Neg   pH: 6.5 Protein: Neg   Glucose: Neg Urobilinogen: 0.2     Nitrites: Neg     Leukocyte Esterase: Neg       ASSESSMENT:       ICD-10 Details   1  GU:   Microscopic hematuria - R31.21    2   Renal colic - N23    3   Ureteral calculus - N20.1      PLAN:               Medications  New Meds: Oxycodone-Acetaminophen 5 mg-325 mg tablet 1-2 tablet PO Q 6 H prn   #30  0 Refill(s)   Zofran 4 mg tablet 1 tablet PO Q 8 H   #10  0 Refill(s)  Orders  Labs BMP   X-Rays: C.T. Stone Protocol Without Contrast   X-Ray Notes: History:    Hematuria: Yes/No    Patient to see MD after exam: Yes/No    Previous exam: CT / IVP/ US/ KUB/ None    When:    Where:    Diabetic: Yes/ No    BUN/ Creatinine:    Date of last BUN Creatinine:    Weight in pounds:    Allergy- IV Contrast: Yes/ No    Conflicting diabetic meds: Yes/ No    Diabetic Meds:    Prior Authorization #: [id number]               Schedule  Return Visit/Planned Activity: Other See Visit Notes              Note: Will call to schedule surgery.             Document  Letter(s):  Created for Patient: Clinical Summary            Notes:   1. Right ureteral calculi: I reviewed his CT scan with him today that confirms that he not only has 1 but actually 2 right ureteral calculi. It would appear that the nonobstructing right renal calculus is now also dropped into the mid right ureter. We discussed the likelihood that he would spontaneously pass the stones is being fairly low. However, he is not in any current distress and has no absolute indication to proceed with urgent treatment. He understands that the development of fever, uncontrolled pain, or persistent nausea/vomiting would cause [place] to proceed more expediently.     In the meantime, we have discussed elective treatment options. Although he will continue medical expulsion therapy with tamsulosin, we have discussed tentatively scheduling definitive surgical intervention. After reviewing all available options, I did recommend ureteroscopic laser lithotripsy treatment. We have reviewed the procedure in detail  including the potential risks, complications, and the expected recovery process as well as the likely need for a postoperative ureteral stent. This will be scheduled for 7-10 days as the patient would like further time for medical expulsion therapy.     Cc: Dr. Kevin Fenton       * Signed by Heloise Purpura, M.D. on 12/26/16 at 2:11 PM (EDT)*             APPENDED NOTES:    He called and has passed one stone. I recommended we keep him scheduled to address his other ureteral stone. He will call back if he passes another stone.         * Signed by Heloise Purpura, M.D. on 12/30/16 at 3:30 PM (EDT)*

## 2017-01-03 NOTE — Progress Notes (Signed)
BMP 12-26-16 on chart

## 2017-01-05 MED ORDER — DEXTROSE 5 % IV SOLN
3.0000 g | INTRAVENOUS | Status: AC
  Administered 2017-01-06: 3 g via INTRAVENOUS
  Filled 2017-01-05: qty 3

## 2017-01-06 ENCOUNTER — Ambulatory Visit (HOSPITAL_COMMUNITY): Payer: BC Managed Care – PPO

## 2017-01-06 ENCOUNTER — Encounter (HOSPITAL_COMMUNITY)
Admission: RE | Disposition: A | Discharge status: Home / Self Care | Payer: Self-pay | Source: Ambulatory Visit | Attending: Urology

## 2017-01-06 ENCOUNTER — Encounter (HOSPITAL_COMMUNITY): Payer: Self-pay | Admitting: *Deleted

## 2017-01-06 ENCOUNTER — Ambulatory Visit (HOSPITAL_COMMUNITY): Payer: BC Managed Care – PPO | Admitting: Anesthesiology

## 2017-01-06 ENCOUNTER — Ambulatory Visit (HOSPITAL_COMMUNITY)
Admission: RE | Admit: 2017-01-06 | Discharge: 2017-01-06 | Disposition: A | Discharge status: Home / Self Care | Payer: BC Managed Care – PPO | Source: Ambulatory Visit | Attending: Urology | Admitting: Urology

## 2017-01-06 DIAGNOSIS — I10 Essential (primary) hypertension: Secondary | ICD-10-CM | POA: Insufficient documentation

## 2017-01-06 DIAGNOSIS — Z79899 Other long term (current) drug therapy: Secondary | ICD-10-CM | POA: Insufficient documentation

## 2017-01-06 DIAGNOSIS — N200 Calculus of kidney: Secondary | ICD-10-CM

## 2017-01-06 DIAGNOSIS — N202 Calculus of kidney with calculus of ureter: Secondary | ICD-10-CM | POA: Insufficient documentation

## 2017-01-06 HISTORY — PX: HOLMIUM LASER APPLICATION: SHX5852

## 2017-01-06 HISTORY — PX: CYSTOSCOPY WITH RETROGRADE PYELOGRAM, URETEROSCOPY AND STENT PLACEMENT: SHX5789

## 2017-01-06 SURGERY — CYSTOURETEROSCOPY, WITH RETROGRADE PYELOGRAM AND STENT INSERTION
Anesthesia: General | Site: Urethra | Laterality: Right

## 2017-01-06 MED ORDER — IOPAMIDOL (ISOVUE-300) INJECTION 61%
INTRAVENOUS | Status: DC | PRN
  Administered 2017-01-06: 2 mL

## 2017-01-06 MED ORDER — DEXAMETHASONE SODIUM PHOSPHATE 10 MG/ML IJ SOLN
INTRAMUSCULAR | Status: DC | PRN
  Administered 2017-01-06: 10 mg via INTRAVENOUS

## 2017-01-06 MED ORDER — SODIUM CHLORIDE 0.9 % IR SOLN
Status: DC | PRN
  Administered 2017-01-06: 1000 mL via INTRAVESICAL
  Administered 2017-01-06: 3000 mL via INTRAVESICAL

## 2017-01-06 MED ORDER — OXYCODONE-ACETAMINOPHEN 5-325 MG PO TABS: 1.0000 | ORAL_TABLET | ORAL | 0 refills | Status: DC | PRN

## 2017-01-06 MED ORDER — FENTANYL CITRATE (PF) 250 MCG/5ML IJ SOLN
INTRAMUSCULAR | Status: AC
  Filled 2017-01-06: qty 5

## 2017-01-06 MED ORDER — ACETAMINOPHEN 10 MG/ML IV SOLN
INTRAVENOUS | Status: AC
  Filled 2017-01-06: qty 100

## 2017-01-06 MED ORDER — DEXAMETHASONE SODIUM PHOSPHATE 10 MG/ML IJ SOLN
INTRAMUSCULAR | Status: AC
  Filled 2017-01-06: qty 1

## 2017-01-06 MED ORDER — MEPERIDINE HCL 50 MG/ML IJ SOLN
INTRAMUSCULAR | Status: AC
  Filled 2017-01-06: qty 1

## 2017-01-06 MED ORDER — MIDAZOLAM HCL 5 MG/5ML IJ SOLN
INTRAMUSCULAR | Status: DC | PRN
  Administered 2017-01-06: 2 mg via INTRAVENOUS

## 2017-01-06 MED ORDER — ACETAMINOPHEN 10 MG/ML IV SOLN
INTRAVENOUS | Status: DC | PRN
  Administered 2017-01-06: 1000 mg via INTRAVENOUS

## 2017-01-06 MED ORDER — ONDANSETRON HCL 4 MG/2ML IJ SOLN
INTRAMUSCULAR | Status: AC
  Filled 2017-01-06: qty 2

## 2017-01-06 MED ORDER — PROMETHAZINE HCL 25 MG/ML IJ SOLN: 6.2500 mg | INTRAMUSCULAR | Status: DC | PRN

## 2017-01-06 MED ORDER — LACTATED RINGERS IV SOLN
INTRAVENOUS | Status: DC
  Administered 2017-01-06 (×2): via INTRAVENOUS

## 2017-01-06 MED ORDER — LIDOCAINE 2% (20 MG/ML) 5 ML SYRINGE
INTRAMUSCULAR | Status: AC
  Filled 2017-01-06: qty 5

## 2017-01-06 MED ORDER — PHENAZOPYRIDINE HCL 100 MG PO TABS: 100.0000 mg | ORAL_TABLET | Freq: Three times a day (TID) | ORAL | 0 refills | Status: DC | PRN

## 2017-01-06 MED ORDER — HYDROMORPHONE HCL 1 MG/ML IJ SOLN: 0.2500 mg | INTRAMUSCULAR | Status: DC | PRN

## 2017-01-06 MED ORDER — PROPOFOL 10 MG/ML IV BOLUS
INTRAVENOUS | Status: DC | PRN
  Administered 2017-01-06 (×2): 100 mg via INTRAVENOUS
  Administered 2017-01-06: 200 mg via INTRAVENOUS

## 2017-01-06 MED ORDER — ONDANSETRON HCL 4 MG/2ML IJ SOLN
INTRAMUSCULAR | Status: DC | PRN
  Administered 2017-01-06: 4 mg via INTRAVENOUS

## 2017-01-06 MED ORDER — PROPOFOL 10 MG/ML IV BOLUS
INTRAVENOUS | Status: AC
  Filled 2017-01-06: qty 40

## 2017-01-06 MED ORDER — LIDOCAINE HCL (CARDIAC) 10 MG/ML IV SOLN
INTRAVENOUS | Status: DC | PRN
  Administered 2017-01-06: 100 mg via INTRAVENOUS

## 2017-01-06 MED ORDER — MIDAZOLAM HCL 2 MG/2ML IJ SOLN
INTRAMUSCULAR | Status: AC
  Filled 2017-01-06: qty 2

## 2017-01-06 MED ORDER — MEPERIDINE HCL 50 MG/ML IJ SOLN
6.2500 mg | INTRAMUSCULAR | Status: DC | PRN
  Administered 2017-01-06 (×2): 12.5 mg via INTRAVENOUS

## 2017-01-06 MED ORDER — FENTANYL CITRATE (PF) 100 MCG/2ML IJ SOLN
INTRAMUSCULAR | Status: DC | PRN
  Administered 2017-01-06 (×4): 50 ug via INTRAVENOUS

## 2017-01-06 SURGICAL SUPPLY — 18 items
BAG URO CATCHER STRL LF (MISCELLANEOUS) ×2 IMPLANT
BASKET ZERO TIP NITINOL 2.4FR (BASKET) ×1 IMPLANT
BSKT STON RTRVL ZERO TP 2.4FR (BASKET) ×1
CATH INTERMIT  6FR 70CM (CATHETERS) IMPLANT
CLOTH BEACON ORANGE TIMEOUT ST (SAFETY) ×2 IMPLANT
COVER SURGICAL LIGHT HANDLE (MISCELLANEOUS) ×2 IMPLANT
FIBER LASER FLEXIVA 365 (UROLOGICAL SUPPLIES) ×1 IMPLANT
FIBER LASER TRAC TIP (UROLOGICAL SUPPLIES) IMPLANT
GLOVE BIOGEL M STRL SZ7.5 (GLOVE) ×2 IMPLANT
GOWN STRL REUS W/TWL LRG LVL3 (GOWN DISPOSABLE) ×4 IMPLANT
GUIDEWIRE ANG ZIPWIRE 038X150 (WIRE) IMPLANT
GUIDEWIRE STR DUAL SENSOR (WIRE) ×2 IMPLANT
IV NS 1000ML (IV SOLUTION) ×2
IV NS 1000ML BAXH (IV SOLUTION) ×1 IMPLANT
MANIFOLD NEPTUNE II (INSTRUMENTS) ×2 IMPLANT
PACK CYSTO (CUSTOM PROCEDURE TRAY) ×2 IMPLANT
SHEATH ACCESS URETERAL 38CM (SHEATH) IMPLANT
TUBING CONNECTING 10 (TUBING) ×2 IMPLANT

## 2017-01-06 NOTE — Anesthesia Procedure Notes (Signed)
Procedure Name: LMA Insertion Date/Time: 01/06/2017 3:50 PM Performed by: Illene SilverEVANS, Yong Wahlquist E Pre-anesthesia Checklist: Patient identified, Emergency Drugs available, Suction available and Patient being monitored Patient Re-evaluated:Patient Re-evaluated prior to inductionOxygen Delivery Method: Circle system utilized Preoxygenation: Pre-oxygenation with 100% oxygen Intubation Type: IV induction Ventilation: Mask ventilation without difficulty LMA: LMA with gastric port inserted Tube type: Oral (sump down stomach  and decompressed stomach) Tube size: 5.0 mm Number of attempts: 1 Airway Equipment and Method: Oral airway Placement Confirmation: positive ETCO2 Tube secured with: Tape Dental Injury: Teeth and Oropharynx as per pre-operative assessment

## 2017-01-06 NOTE — Transfer of Care (Signed)
Immediate Anesthesia Transfer of Care Note  Patient: Eugene BealsRyan Love  Procedure(s) Performed: Procedure(s): CYSTOSCOPY WITH RIGHT RETROGRADE PYELOGRAM, URETEROSCOPY WITH STONE REMOVAL (Right) HOLMIUM LASER APPLICATION (Right)  Patient Location: PACU  Anesthesia Type:General  Level of Consciousness: awake, alert , oriented and patient cooperative  Airway & Oxygen Therapy: Patient Spontanous Breathing and Patient connected to face mask oxygen  Post-op Assessment: Report given to RN, Post -op Vital signs reviewed and stable and Patient moving all extremities X 4  Post vital signs: stable  Last Vitals:  Vitals:   01/06/17 1226 01/06/17 1630  BP: (!) 144/111 (!) 145/99  Pulse: 77 100  Resp: 18 18  Temp: 37 C 36.5 C    Last Pain:  Vitals:   01/06/17 1226  TempSrc: Oral         Complications: No apparent anesthesia complications

## 2017-01-06 NOTE — Discharge Instructions (Addendum)
Cystoscopy, Care After Refer to this sheet in the next few weeks. These instructions provide you with information about caring for yourself after your procedure. Your health care provider may also give you more specific instructions. Your treatment has been planned according to current medical practices, but problems sometimes occur. Call your health care provider if you have any problems or questions after your procedure. What can I expect after the procedure? After the procedure, it is common to have: Mild pain when you urinate. Pain should stop within a few minutes after you urinate. This may last for up to 1 week. A small amount of blood in your urine for several days. Feeling like you need to urinate but producing only a small amount of urine. Follow these instructions at home:   Medicines  Take over-the-counter and prescription medicines only as told by your health care provider. If you were prescribed an antibiotic medicine, take it as told by your health care provider. Do not stop taking the antibiotic even if you start to feel better. General instructions   Return to your normal activities as told by your health care provider. Ask your health care provider what activities are safe for you. Do not drive for 24 hours if you received a sedative. Watch for any blood in your urine. If the amount of blood in your urine increases, call your health care provider. Follow instructions from your health care provider about eating or drinking restrictions. If a tissue sample was removed for testing (biopsy) during your procedure, it is your responsibility to get your test results. Ask your health care provider or the department performing the test when your results will be ready. Drink enough fluid to keep your urine clear or pale yellow. Keep all follow-up visits as told by your health care provider. This is important. Contact a health care provider if: You have pain that gets worse or does not  get better with medicine, especially pain when you urinate. You have difficulty urinating. Get help right away if: You have more blood in your urine. You have blood clots in your urine. You have abdominal pain. You have a fever or chills. You are unable to urinate. This information is not intended to replace advice given to you by your health care provider. Make sure you discuss any questions you have with your health care provider. Document Released: 03/01/2005 Document Revised: 01/18/2016 Document Reviewed: 06/29/2015 Elsevier Interactive Patient Education  2017 Elsevier [employer] 1. You may see some blood in the urine and may have some burning with urination for 48-72 hours. You also may notice that you have to urinate more frequently or urgently after your procedure which is normal.  2. You should call should you develop an inability urinate, fever > 101, persistent nausea and vomiting that prevents you from eating or drinking to stay hydrated.

## 2017-01-06 NOTE — Op Note (Signed)
  Preoperative diagnosis: Right ureteral calculus  Postoperative diagnosis: Right ureteral calculus  Procedure:  1. Cystoscopy 2. Right ureteroscopy and stone removal 3. Ureteroscopic laser lithotripsy 4. Right retrograde pyelography with interpretation  Surgeon: Moody BruinsLester S. Dell Hurtubise, Jr. M.D.  Anesthesia: General  Complications: None  Intraoperative findings: Right retrograde pyelography demonstrated a filling defect within the distal right ureter consistent with the patient's known calculus without other abnormalities.  EBL: Minimal  Specimens: 1. Right ureteral calculus  Disposition of specimens: [facility] for stone analysis  Indication: Eugene Love is a 31 y.o. year old patient with urolithiasis.   He has a distal right ureteral calculus. After reviewing the management options for treatment, the patient elected to proceed with the above surgical procedure(s). We have discussed the potential benefits and risks of the procedure, side effects of the proposed treatment, the likelihood of the patient achieving the goals of the procedure, and any potential problems that might occur during the procedure or recuperation. Informed consent has been obtained.  Description of procedure:  The patient was taken to the operating room and general anesthesia was induced.  The patient was placed in the dorsal lithotomy position, prepped and draped in the usual sterile fashion, and preoperative antibiotics were administered. A preoperative time-out was performed.   Cystourethroscopy was performed.  The patient's urethra was examined and was normal. The bladder was then systematically examined in its entirety. There was no evidence for any bladder tumors, stones, or other mucosal pathology.    Attention then turned to the right ureteral orifice and a ureteral catheter was used to intubate the ureteral orifice.  Omnipaque contrast was injected through the ureteral catheter and a  retrograde pyelogram was performed with findings as dictated above.  A 0.38 sensor guidewire was then advanced up the right ureter into the renal pelvis under fluoroscopic guidance. The 6 Fr semirigid ureteroscope was then advanced into the ureter next to the guidewire and the calculus was identified.   The stone was then fragmented with the 365 micron holmium laser fiber on a setting of 0.6 J and frequency of 6 Hz.   All stones were then removed from the ureter with a zero tip nitinol basket.  Reinspection of the ureter revealed no remaining visible stones or fragments.   It was felt that no stent was necessary.  The bladder was then emptied and the procedure ended.  The patient appeared to tolerate the procedure well and without complications.  The patient was able to be awakened and transferred to the recovery unit in satisfactory condition.

## 2017-01-06 NOTE — Anesthesia Preprocedure Evaluation (Signed)
Anesthesia Evaluation  Patient identified by MRN, date of birth, ID band Patient awake    Reviewed: Allergy & Precautions, NPO status , Patient's Chart, lab work & pertinent test results  Airway Mallampati: II  TM Distance: >3 FB Neck ROM: Full    Dental no notable dental hx.    Pulmonary neg pulmonary ROS,    Pulmonary exam normal breath sounds clear to auscultation       Cardiovascular hypertension, Pt. on medications Normal cardiovascular exam Rhythm:Regular Rate:Normal     Neuro/Psych negative neurological ROS  negative psych ROS   GI/Hepatic negative GI ROS, Neg liver ROS,   Endo/Other  negative endocrine ROS  Renal/GU negative Renal ROS     Musculoskeletal negative musculoskeletal ROS (+)   Abdominal   Peds  Hematology negative hematology ROS (+)   Anesthesia Other Findings   Reproductive/Obstetrics                             Anesthesia Physical Anesthesia Plan  ASA: III  Anesthesia Plan: General   Post-op Pain Management:    Induction: Intravenous  Airway Management Planned: LMA  Additional Equipment:   Intra-op Plan:   Post-operative Plan: Extubation in OR  Informed Consent: I have reviewed the patients History and Physical, chart, labs and discussed the procedure including the risks, benefits and alternatives for the proposed anesthesia with the patient or authorized representative who has indicated his/her understanding and acceptance.   Dental advisory given  Plan Discussed with: CRNA  Anesthesia Plan Comments:         Anesthesia Quick Evaluation

## 2017-01-06 NOTE — Interval H&P Note (Signed)
History and Physical Interval Note:  01/06/2017 2:24 PM  Eugene BealsRyan Love  has presented today for surgery, with the diagnosis of RIGHT URETERAL CALCULI  The various methods of treatment have been discussed with the patient and family. After consideration of risks, benefits and other options for treatment, the patient has consented to  Procedure(s): CYSTOSCOPY WITH RETROGRADE PYELOGRAM, URETEROSCOPY AND STENT PLACEMENT (Right) HOLMIUM LASER APPLICATION (Right) as a surgical intervention .  The patient's history has been reviewed, patient examined, no change in status, stable for surgery. KUB today demonstrated calcification in the vicinity of the right distal ureter.  I have reviewed the patient's chart and labs.  Questions were answered to the patient's satisfaction.     [name],LES

## 2017-01-06 NOTE — Progress Notes (Signed)
Dr Laverle PatterBorden at bedside. States pt does not need to void bladder before discharge home.

## 2017-01-06 NOTE — Transfer of Care (Signed)
Immediate Anesthesia Transfer of Care Note  Patient: Everardo BealsRyan Vigilante  Procedure(s) Performed: Procedure(s): CYSTOSCOPY WITH RIGHT RETROGRADE PYELOGRAM, URETEROSCOPY WITH STONE REMOVAL (Right) HOLMIUM LASER APPLICATION (Right)  Patient Location: PACU  Anesthesia Type:General  Level of Consciousness: awake, alert , oriented and patient cooperative  Airway & Oxygen Therapy: Patient Spontanous Breathing and Patient connected to face mask oxygen  Post-op Assessment: Report given to RN, Post -op Vital signs reviewed and stable and Patient moving all extremities X 4  Post vital signs: stable  Last Vitals:  Vitals:   01/06/17 1630 01/06/17 1640  BP: (!) 145/99 (!) 133/95  Pulse: 100 76  Resp: 18 12  Temp: 36.5 C     Last Pain:  Vitals:   01/06/17 1226  TempSrc: Oral         Complications: No apparent anesthesia complications

## 2017-01-07 ENCOUNTER — Encounter (HOSPITAL_COMMUNITY): Payer: Self-pay | Admitting: Urology

## 2017-01-08 NOTE — Anesthesia Postprocedure Evaluation (Signed)
Anesthesia Post Note  Patient: Eugene BealsRyan Love  Procedure(s) Performed: Procedure(s) (LRB): CYSTOSCOPY WITH RIGHT RETROGRADE PYELOGRAM, URETEROSCOPY WITH STONE REMOVAL (Right) HOLMIUM LASER APPLICATION (Right)  Patient location during evaluation: PACU Anesthesia Type: General Level of consciousness: sedated and patient cooperative Pain management: pain level controlled Vital Signs Assessment: post-procedure vital signs reviewed and stable Respiratory status: spontaneous breathing Cardiovascular status: stable Anesthetic complications: no       Last Vitals:  Vitals:   01/06/17 1700 01/06/17 1711  BP: 135/77 122/85  Pulse: 69   Resp: 14 16  Temp: 36.4 C 36.7 C    Last Pain:  Vitals:   01/06/17 1226  TempSrc: Oral                 Lewie LoronJohn Brodrick Curran

## 2017-02-24 ENCOUNTER — Encounter: Payer: Self-pay | Admitting: Physician Assistant

## 2017-02-24 ENCOUNTER — Ambulatory Visit (INDEPENDENT_AMBULATORY_CARE_PROVIDER_SITE_OTHER): Payer: BC Managed Care – PPO | Admitting: Physician Assistant

## 2017-02-24 VITALS — BP 121/80 | HR 74 | Temp 97.7°F | Ht 71.0 in | Wt 268.2 lb

## 2017-02-24 DIAGNOSIS — B009 Herpesviral infection, unspecified: Secondary | ICD-10-CM

## 2017-02-24 DIAGNOSIS — I10 Essential (primary) hypertension: Secondary | ICD-10-CM

## 2017-02-24 MED ORDER — LISINOPRIL 5 MG PO TABS: ORAL_TABLET | ORAL | 3 refills | Status: DC

## 2017-02-24 MED ORDER — VALACYCLOVIR HCL 1 G PO TABS: 1000.0000 mg | ORAL_TABLET | Freq: Two times a day (BID) | ORAL | 11 refills | Status: DC

## 2017-02-24 NOTE — Patient Instructions (Signed)
In a few days you may receive a survey in the mail or online from [employer] regarding your visit with us today. Please take a moment to fill this out. Your feedback is very important to our whole office. It can help us better understand your needs as well as improve your experience and satisfaction. Thank you for taking your time to complete it. We care about you.  Eilam Shrewsbury, PA-C  

## 2017-02-25 DIAGNOSIS — B009 Herpesviral infection, unspecified: Secondary | ICD-10-CM | POA: Insufficient documentation

## 2017-02-25 DIAGNOSIS — I10 Essential (primary) hypertension: Secondary | ICD-10-CM | POA: Insufficient documentation

## 2017-02-25 NOTE — Progress Notes (Signed)
BP 121/80   Pulse 74   Temp 97.7 F (36.5 C) (Oral)   Ht 5\' 11"  (1.803 m)   Wt 268 lb 3.2 oz (121.7 kg)   BMI 37.41 kg/m    Subjective:    Patient ID: Eugene Love, male    DOB: 02/13/1986, 31 y.o.   MRN: [id number]  HPI: Eugene Love is a 31 y.o. male presenting on 02/24/2017 for Hypertension (Getting high numbers when checking throughout the day)  Overall he is trying to control his blood pressure very well and doing fairly good. Sometimes his blood pressure will syncopal little bit. Always trying to lose weight and do well with salt removal from his diet. He is tolerating his lisinopril very well. Another issue that he has had is exacerbations of herpes. He has never been treated for this. He has had episodes over the past 3 years primarily during the summer months. He is currently having one that's gone on for greater than a week. He does not know of any other exposure he has had. The first exacerbation was several years ago. He is interested in getting treatment for this. We have discussed acute treatment and chronic daily treatment as a controller. He is to try both.  Relevant past medical, surgical, family and social history reviewed and updated as indicated. Allergies and medications reviewed and updated.  Past Medical History:  Diagnosis Date  . Cellulitis    inner thigh   . History of right flank pain   . Hypertension     Past Surgical History:  Procedure Laterality Date  . CYSTOSCOPY WITH RETROGRADE PYELOGRAM, URETEROSCOPY AND STENT PLACEMENT Right 01/06/2017   Procedure: CYSTOSCOPY WITH RIGHT RETROGRADE PYELOGRAM, URETEROSCOPY WITH STONE REMOVAL;  Surgeon: Heloise PurpuraBorden, Lester, MD;  Location: [facility];  Service: Urology;  Laterality: Right;  . HOLMIUM LASER APPLICATION Right 01/06/2017   Procedure: HOLMIUM LASER APPLICATION;  Surgeon: Heloise PurpuraBorden, Lester, MD;  Location: [facility];  Service: Urology;  Laterality: Right;  . KNEE ARTHROTOMY    . URETHRAL DILATION      Review of Systems    Constitutional: Negative.  Negative for appetite change and fatigue.  HENT: Negative.   Eyes: Negative.  Negative for pain and visual disturbance.  Respiratory: Negative.  Negative for cough, chest tightness, shortness of breath and wheezing.   Cardiovascular: Negative.  Negative for chest pain, palpitations and leg swelling.  Gastrointestinal: Negative.  Negative for abdominal pain, diarrhea, nausea and vomiting.  Endocrine: Negative.   Genitourinary: Positive for genital sores and penile pain.  Musculoskeletal: Negative.   Skin: Negative.  Negative for color change and rash.  Neurological: Negative.  Negative for weakness, numbness and headaches.  Psychiatric/Behavioral: Negative.     Allergies as of 02/24/2017   No Known Allergies     Medication List       Accurate as of 02/24/17 11:59 PM. Always use your most recent med list.          clonazePAM 0.5 MG tablet Commonly known as:  KLONOPIN TAKE ONE (1) TABLET AT BEDTIME   lisinopril 5 MG tablet Commonly known as:  PRINIVIL,ZESTRIL TAKE ONE (1) TABLET EACH DAY   multivitamin with minerals Tabs tablet Take 1 tablet by mouth daily. Men's Health Multivitamin   valACYclovir 1000 MG tablet Commonly known as:  VALTREX Take 1 tablet (1,000 mg total) by mouth 2 (two) times daily. After 10 days take one daily as prevention          Objective:  BP 121/80   Pulse 74   Temp 97.7 F (36.5 C) (Oral)   Ht 5\' 11"  (1.803 m)   Wt 268 lb 3.2 oz (121.7 kg)   BMI 37.41 kg/m   No Known Allergies  Physical Exam  Constitutional: He appears well-developed and well-nourished.  HENT:  Head: Normocephalic and atraumatic.  Eyes: Conjunctivae and EOM are normal. Pupils are equal, round, and reactive to light.  Neck: Normal range of motion. Neck supple.  Cardiovascular: Normal rate, regular rhythm and normal heart sounds.   Pulmonary/Chest: Effort normal and breath sounds normal.  Abdominal: Soft. Bowel sounds are normal.   Musculoskeletal: Normal range of motion.  Skin: Skin is warm and dry.    Results for orders placed or performed during the hospital encounter of 01/03/17  CBC  Result Value Ref Range   WBC 6.7 4.0 - 10.5 K/uL   RBC 4.90 4.22 - 5.81 MIL/uL   Hemoglobin 14.1 13.0 - 17.0 g/dL   HCT 96.0 45.4 - 09.8 %   MCV 87.3 78.0 - 100.0 fL   MCH 28.8 26.0 - 34.0 pg   MCHC 32.9 30.0 - 36.0 g/dL   RDW 11.9 14.7 - 82.9 %   Platelets 277 150 - 400 K/uL      Assessment & Plan:   1. Herpes - valACYclovir (VALTREX) 1000 MG tablet; Take 1 tablet (1,000 mg total) by mouth 2 (two) times daily. After 10 days take one daily as prevention  Dispense: 40 tablet; Refill: 11  2. Essential hypertension - lisinopril (PRINIVIL,ZESTRIL) 5 MG tablet; TAKE ONE (1) TABLET EACH DAY  Dispense: 90 tablet; Refill: 3   Current Outpatient Prescriptions:  .  clonazePAM (KLONOPIN) 0.5 MG tablet, TAKE ONE (1) TABLET AT BEDTIME (Patient taking differently: TAKE ONE (1) TABLET AT BEDTIME AS NEEDED FOR SLEEP.), Disp: 30 tablet, Rfl: 2 .  lisinopril (PRINIVIL,ZESTRIL) 5 MG tablet, TAKE ONE (1) TABLET EACH DAY, Disp: 90 tablet, Rfl: 3 .  Multiple Vitamin (MULTIVITAMIN WITH MINERALS) TABS tablet, Take 1 tablet by mouth daily. Men's Health Multivitamin, Disp: , Rfl:  .  valACYclovir (VALTREX) 1000 MG tablet, Take 1 tablet (1,000 mg total) by mouth 2 (two) times daily. After 10 days take one daily as prevention, Disp: 40 tablet, Rfl: 11  Continue all other maintenance medications as listed above.  Follow up plan: Follow-up as needed or worsening of symptoms. Call office for any issues.  Educational handout given for survey  Remus Loffler PA-C [facility] [address]  [place], [place] [address] [phone]   02/25/2017, 8:24 AM

## 2017-03-07 ENCOUNTER — Other Ambulatory Visit: Payer: Self-pay | Admitting: Nurse Practitioner

## 2017-03-07 NOTE — Telephone Encounter (Signed)
Please call in clonazepam with 1 refills 

## 2017-03-07 NOTE — Telephone Encounter (Signed)
Last seen 02/24/17  MMM If approved route to nurse to call into [facility]

## 2017-03-07 NOTE — Telephone Encounter (Signed)
Refill called to [facility] 

## 2017-05-15 ENCOUNTER — Encounter: Payer: Self-pay | Admitting: Family Medicine

## 2017-05-15 ENCOUNTER — Ambulatory Visit (INDEPENDENT_AMBULATORY_CARE_PROVIDER_SITE_OTHER): Payer: BC Managed Care – PPO | Admitting: Family Medicine

## 2017-05-15 VITALS — BP 130/81 | HR 75 | Temp 98.4°F | Ht 71.0 in | Wt 269.0 lb

## 2017-05-15 DIAGNOSIS — H60332 Swimmer's ear, left ear: Secondary | ICD-10-CM

## 2017-05-15 MED ORDER — OFLOXACIN 0.3 % OT SOLN: 5.0000 [drp] | Freq: Every day | OTIC | 0 refills | Status: DC

## 2017-05-15 NOTE — Progress Notes (Signed)
BP 130/81   Pulse 75   Temp 98.4 F (36.9 C) (Oral)   Ht  (1.803 m)   Wt 269 lb (122 kg)   BMI 37.52 kg/m    Subjective:    Patient ID: Eugene Love, male    DOB: 14-Nov-1985, 31 y.o.   MRN: [id number]  HPI: Eugene Love is a 31 y.o. male presenting on 05/15/2017 for Left ear pain (x 2 days, worsening)   HPI Left ear pain Patient has been having left ear pain 2 days. He's been having drainage and odor and swelling in that left ear and the pain radiates down into his left jaw. He says is similar to when he had a weird fungal infection over a year ago in that left ear after going to the [facility] in [place]. He thinks this may be similar was concerned about that. He denies any fevers or chills or congestion or nasal drainage.  Relevant past medical, surgical, family and social history reviewed and updated as indicated. Interim medical history since our last visit reviewed. Allergies and medications reviewed and updated.  Review of Systems  Constitutional: Negative for chills and fever.  HENT: Positive for ear discharge and ear pain. Negative for congestion, hearing loss, sinus pain, sinus pressure and sore throat.   Respiratory: Negative for cough, shortness of breath and wheezing.   Cardiovascular: Negative for chest pain and leg swelling.  Musculoskeletal: Negative for back pain and gait problem.  Skin: Negative for rash.  All other systems reviewed and are negative.   Per HPI unless specifically indicated above        Objective:    BP 130/81   Pulse 75   Temp 98.4 F (36.9 C) (Oral)   Ht  (1.803 m)   Wt 269 lb (122 kg)   BMI 37.52 kg/m   Wt Readings from Last 3 Encounters:  05/15/17 269 lb (122 kg)  02/24/17 268 lb 3.2 oz (121.7 kg)  01/06/17 272 lb 3 oz (123.5 kg)    Physical Exam  Constitutional: He is oriented to person, place, and time. He appears well-developed and well-nourished. No distress.  HENT:  Right Ear: Hearing,  tympanic membrane and ear canal normal.  Left Ear: Hearing normal. There is drainage, swelling and tenderness. Tympanic membrane is not injected, not perforated, not erythematous, not retracted and not bulging.  No middle ear effusion.  Eyes: Conjunctivae are normal. No scleral icterus.  Cardiovascular: Normal rate, regular rhythm, normal heart sounds and intact distal pulses.   No murmur heard. Pulmonary/Chest: Effort normal and breath sounds normal. No respiratory distress. He has no wheezes.  Musculoskeletal: Normal range of motion. He exhibits no edema.  Neurological: He is alert and oriented to person, place, and time. Coordination normal.  Skin: Skin is warm and dry. No rash noted. He is not diaphoretic.  Psychiatric: He has a normal mood and affect. His behavior is normal.  Nursing note and vitals reviewed.     Assessment & Plan:   Problem List Items Addressed This Visit    None    Visit Diagnoses    Acute swimmer's ear of left side    -  Primary   Relevant Medications   ofloxacin (FLOXIN OTIC) 0.3 % OTIC solution      Follow up plan: Return if symptoms worsen or fail to improve.  Counseling provided for all of the vaccine components No orders of the defined types were placed in this encounter.  Arville Care, MD [facility] 05/15/2017, 1:22 PM

## 2017-05-16 ENCOUNTER — Telehealth: Payer: Self-pay | Admitting: Family Medicine

## 2017-05-16 MED ORDER — ACETIC ACID 2 % OT SOLN: 4.0000 [drp] | Freq: Three times a day (TID) | OTIC | 0 refills | Status: DC

## 2017-05-16 NOTE — Telephone Encounter (Signed)
Patient states that he was seen yesterday and was given ear drops. Patient states it has gotten worse and would like the message sent to [facility] since she had seen him before at Urgent care. Would like to know what she would recommend since it is worse.

## 2017-07-18 ENCOUNTER — Encounter: Payer: Self-pay | Admitting: Family Medicine

## 2017-07-18 ENCOUNTER — Ambulatory Visit: Payer: BC Managed Care – PPO | Admitting: Family Medicine

## 2017-07-18 VITALS — BP 136/93 | HR 98 | Temp 98.1°F | Ht 71.0 in | Wt 265.0 lb

## 2017-07-18 DIAGNOSIS — R059 Cough, unspecified: Secondary | ICD-10-CM

## 2017-07-18 DIAGNOSIS — R05 Cough: Secondary | ICD-10-CM | POA: Diagnosis not present

## 2017-07-18 MED ORDER — AZITHROMYCIN 250 MG PO TABS: ORAL_TABLET | ORAL | 0 refills | Status: DC

## 2017-07-18 MED ORDER — HYDROCODONE-HOMATROPINE 5-1.5 MG/5ML PO SYRP: 5.0000 mL | ORAL_SOLUTION | Freq: Every evening | ORAL | 0 refills | Status: DC | PRN

## 2017-07-18 NOTE — Addendum Note (Signed)
Addended by: Magdalene RiverBULLINS, Makynzi Eastland H on: 07/18/2017 02:16 PM   Modules accepted: Orders

## 2017-07-18 NOTE — Progress Notes (Signed)
Patient ID: Eugene Love, male   DOB: 10/30/1985, 31 y.o.   MRN: [id number] Patient here today for cough and congestion that started a week ago and is steadily getting worse.  Patient has had some intermittent cough but he assumed this was allergies.  I pointed out to him that he is on an ACE inhibitor and we frequently see cough as a side effect. There does seem to be some congestion, as well currently.  Cough has been keeping him and his wife awake at night.  He exhibits lots of dry coughing during the course of his office visit today.  He does have some sore throat in addition.      Patient Active Problem List   Diagnosis Date Noted  . Herpes 02/25/2017  . Essential hypertension 02/25/2017   Outpatient Encounter Medications as of 07/18/2017  Medication Sig  . clonazePAM (KLONOPIN) 0.5 MG tablet TAKE ONE (1) TABLET AT BEDTIME  . lisinopril (PRINIVIL,ZESTRIL) 5 MG tablet TAKE ONE (1) TABLET EACH DAY  . Multiple Vitamin (MULTIVITAMIN WITH MINERALS) TABS tablet Take 1 tablet by mouth daily. Men's Health Multivitamin  . valACYclovir (VALTREX) 1000 MG tablet Take 1 tablet (1,000 mg total) by mouth 2 (two) times daily. After 10 days take one daily as prevention (Patient not taking: Reported on 07/18/2017)  . [DISCONTINUED] acetic acid (VOSOL) 2 % otic solution Place 4 drops into both ears 3 (three) times daily.  . [DISCONTINUED] ofloxacin (FLOXIN OTIC) 0.3 % OTIC solution Place 5 drops into the left ear daily. Give for 7 days   No facility-administered encounter medications on file as of 07/18/2017.       BP (!) 136/91 (BP Location: Right Arm)   Pulse 98   Temp 98.1 F (36.7 C) (Oral)   Ht 5\' 11"  (1.803 m)   Wt 265 lb (120.2 kg)   BMI 36.96 kg/m  Patient in no acute distress, but coughing. HEENT: Sinuses are nontender.  Ears are clear.  Throat is benign. Chest: Lungs are clear to percussion and auscultation. Heart: Regular without murmurs or gallops.  Assessment: Probable upper  respiratory infection but with prolonged history and sore throat will treat with Z-Pak as well as cough suppressant, which he is to take primarily at night although may use in the daytime to as needed. If cough does not resolve in 1-2 weeks consider replacing lisinopril with a second generation ACE.

## 2017-07-28 ENCOUNTER — Encounter: Payer: Self-pay | Admitting: Physician Assistant

## 2017-07-28 ENCOUNTER — Ambulatory Visit: Payer: BC Managed Care – PPO | Admitting: Physician Assistant

## 2017-07-28 VITALS — BP 133/88 | HR 74 | Temp 97.8°F | Ht 71.0 in | Wt 269.0 lb

## 2017-07-28 DIAGNOSIS — J189 Pneumonia, unspecified organism: Secondary | ICD-10-CM

## 2017-07-28 MED ORDER — PREDNISONE 10 MG (48) PO TBPK: ORAL_TABLET | ORAL | 0 refills | Status: DC

## 2017-07-28 MED ORDER — HYDROCODONE-HOMATROPINE 5-1.5 MG/5ML PO SYRP: 5.0000 mL | ORAL_SOLUTION | Freq: Four times a day (QID) | ORAL | 0 refills | Status: DC | PRN

## 2017-07-28 MED ORDER — CEFDINIR 300 MG PO CAPS: 300.0000 mg | ORAL_CAPSULE | Freq: Two times a day (BID) | ORAL | 0 refills | Status: DC

## 2017-07-28 MED ORDER — ALBUTEROL SULFATE HFA 108 (90 BASE) MCG/ACT IN AERS: 2.0000 | INHALATION_SPRAY | Freq: Four times a day (QID) | RESPIRATORY_TRACT | 0 refills | Status: DC | PRN

## 2017-07-28 NOTE — Patient Instructions (Signed)
In a few days you may receive a survey in the mail or online from [employer] regarding your visit with us today. Please take a moment to fill this out. Your feedback is very important to our whole office. It can help us better understand your needs as well as improve your experience and satisfaction. Thank you for taking your time to complete it. We care about you.  [place] Skalicky, PA-C  

## 2017-07-28 NOTE — Progress Notes (Signed)
BP 133/88   Pulse 74   Temp 97.8 F (36.6 C) (Oral)   Ht 5\' 11"  (1.803 m)   Wt 269 lb (122 kg)   BMI 37.52 kg/m    Subjective:    Patient ID: Eugene Love, male    DOB: [birth year], 31 y.o.   MRN: [id number]  HPI: Eugene Love is a 31 y.o. male presenting on 07/28/2017 for Cough (x 2-3 weeks)  Patient with several days of progressing upper respiratory and bronchial symptoms. Initially there was more upper respiratory congestion. This progressed to having significant cough that is productive throughout the day and severe at night. There is occasional wheezing after coughing. Sometimes there is slight dyspnea on exertion. It is productive mucus that is yellow in color. Denies any blood.   Relevant past medical, surgical, family and social history reviewed and updated as indicated. Allergies and medications reviewed and updated.  Past Medical History:  Diagnosis Date  . Cellulitis    inner thigh   . History of right flank pain   . Hypertension     Past Surgical History:  Procedure Laterality Date  . CYSTOSCOPY WITH RETROGRADE PYELOGRAM, URETEROSCOPY AND STENT PLACEMENT Right 01/06/2017   Procedure: CYSTOSCOPY WITH RIGHT RETROGRADE PYELOGRAM, URETEROSCOPY WITH STONE REMOVAL;  Surgeon: Heloise Purpura, MD;  Location: [facility];  Service: Urology;  Laterality: Right;  . HOLMIUM LASER APPLICATION Right 01/06/2017   Procedure: HOLMIUM LASER APPLICATION;  Surgeon: Heloise Purpura, MD;  Location: [facility];  Service: Urology;  Laterality: Right;  . KNEE ARTHROTOMY    . URETHRAL DILATION      Review of Systems  Constitutional: Positive for fatigue. Negative for appetite change and fever.  HENT: Positive for congestion, sinus pressure and sore throat.   Eyes: Negative.  Negative for pain and visual disturbance.  Respiratory: Positive for cough, shortness of breath and wheezing. Negative for chest tightness.   Cardiovascular: Negative.  Negative for chest pain, palpitations and leg swelling.    Gastrointestinal: Negative.  Negative for abdominal pain, diarrhea, nausea and vomiting.  Endocrine: Negative.   Genitourinary: Negative.   Musculoskeletal: Negative for back pain and myalgias.  Skin: Negative.  Negative for color change and rash.  Neurological: Positive for headaches. Negative for weakness and numbness.  Psychiatric/Behavioral: Negative.     Allergies as of 07/28/2017   No Known Allergies     Medication List        Accurate as of 07/28/17  2:54 PM. Always use your most recent med list.          albuterol 108 (90 Base) MCG/ACT inhaler Commonly known as:  PROVENTIL HFA;VENTOLIN HFA Inhale 2 puffs into the lungs every 6 (six) hours as needed for wheezing or shortness of breath.   cefdinir 300 MG capsule Commonly known as:  OMNICEF Take 1 capsule (300 mg total) by mouth 2 (two) times daily. 1 po BID   clonazePAM 0.5 MG tablet Commonly known as:  KLONOPIN TAKE ONE (1) TABLET AT BEDTIME   HYDROcodone-homatropine 5-1.5 MG/5ML syrup Commonly known as:  HYCODAN Take 5-10 mLs by mouth at bedtime as needed for cough.   HYDROcodone-homatropine 5-1.5 MG/5ML syrup Commonly known as:  HYCODAN Take 5-10 mLs by mouth every 6 (six) hours as needed.   lisinopril 5 MG tablet Commonly known as:  PRINIVIL,ZESTRIL TAKE ONE (1) TABLET EACH DAY   multivitamin with minerals Tabs tablet Take 1 tablet by mouth daily. Men's Health Multivitamin   predniSONE 10 MG (48) Tbpk tablet Commonly known  as:  STERAPRED UNI-PAK 48 TAB Take as directed   valACYclovir 1000 MG tablet Commonly known as:  VALTREX Take 1 tablet (1,000 mg total) by mouth 2 (two) times daily. After 10 days take one daily as prevention          Objective:    BP 133/88   Pulse 74   Temp 97.8 F (36.6 C) (Oral)   Ht 5\' 11"  (1.803 m)   Wt 269 lb (122 kg)   BMI 37.52 kg/m   No Known Allergies  Physical Exam  Constitutional: He appears well-developed and well-nourished.  HENT:  Head:  Normocephalic and atraumatic.  Right Ear: Hearing and tympanic membrane normal.  Left Ear: Hearing and tympanic membrane normal.  Nose: Mucosal edema and sinus tenderness present. No nasal deformity. Right sinus exhibits frontal sinus tenderness. Left sinus exhibits frontal sinus tenderness.  Mouth/Throat: Posterior oropharyngeal erythema present.  Eyes: Conjunctivae and EOM are normal. Pupils are equal, round, and reactive to light. Right eye exhibits no discharge. Left eye exhibits no discharge.  Neck: Normal range of motion. Neck supple.  Cardiovascular: Normal rate, regular rhythm and normal heart sounds.  Pulmonary/Chest: Effort normal. No respiratory distress. He has no decreased breath sounds. He has wheezes. He has no rhonchi. He has no rales.  Abdominal: Soft. Bowel sounds are normal.  Musculoskeletal: Normal range of motion.  Skin: Skin is warm and dry.        Assessment & Plan:   1. Atypical pneumonia - HYDROcodone-homatropine (HYCODAN) 5-1.5 MG/5ML syrup; Take 5-10 mLs by mouth every 6 (six) hours as needed.  Dispense: 240 mL; Refill: 0 - albuterol (PROVENTIL HFA;VENTOLIN HFA) 108 (90 Base) MCG/ACT inhaler; Inhale 2 puffs into the lungs every 6 (six) hours as needed for wheezing or shortness of breath.  Dispense: 1 Inhaler; Refill: 0 - predniSONE (STERAPRED UNI-PAK 48 TAB) 10 MG (48) TBPK tablet; Take as directed  Dispense: 48 tablet; Refill: 0 - cefdinir (OMNICEF) 300 MG capsule; Take 1 capsule (300 mg total) by mouth 2 (two) times daily. 1 po BID  Dispense: 20 capsule; Refill: 0     Current Outpatient Medications:  .  clonazePAM (KLONOPIN) 0.5 MG tablet, TAKE ONE (1) TABLET AT BEDTIME, Disp: 60 tablet, Rfl: 1 .  HYDROcodone-homatropine (HYCODAN) 5-1.5 MG/5ML syrup, Take 5-10 mLs by mouth at bedtime as needed for cough., Disp: 120 mL, Rfl: 0 .  lisinopril (PRINIVIL,ZESTRIL) 5 MG tablet, TAKE ONE (1) TABLET EACH DAY, Disp: 90 tablet, Rfl: 3 .  Multiple Vitamin (MULTIVITAMIN  WITH MINERALS) TABS tablet, Take 1 tablet by mouth daily. Men's Health Multivitamin, Disp: , Rfl:  .  valACYclovir (VALTREX) 1000 MG tablet, Take 1 tablet (1,000 mg total) by mouth 2 (two) times daily. After 10 days take one daily as prevention, Disp: 40 tablet, Rfl: 11 .  albuterol (PROVENTIL HFA;VENTOLIN HFA) 108 (90 Base) MCG/ACT inhaler, Inhale 2 puffs into the lungs every 6 (six) hours as needed for wheezing or shortness of breath., Disp: 1 Inhaler, Rfl: 0 .  cefdinir (OMNICEF) 300 MG capsule, Take 1 capsule (300 mg total) by mouth 2 (two) times daily. 1 po BID, Disp: 20 capsule, Rfl: 0 .  HYDROcodone-homatropine (HYCODAN) 5-1.5 MG/5ML syrup, Take 5-10 mLs by mouth every 6 (six) hours as needed., Disp: 240 mL, Rfl: 0 .  predniSONE (STERAPRED UNI-PAK 48 TAB) 10 MG (48) TBPK tablet, Take as directed, Disp: 48 tablet, Rfl: 0 Continue all other maintenance medications as listed above.  Follow up plan: Return if  symptoms worsen or fail to improve.  Educational handout given for survey  Remus LofflerAngel S. Deondra Labrador PA-C [facility] [address]  [place], [place] [phone]   07/28/2017, 2:54 PM

## 2017-08-09 ENCOUNTER — Other Ambulatory Visit: Payer: Self-pay | Admitting: Nurse Practitioner

## 2017-08-11 NOTE — Telephone Encounter (Signed)
Patient aware.

## 2017-08-27 ENCOUNTER — Other Ambulatory Visit: Payer: Self-pay | Admitting: Physician Assistant

## 2017-08-27 DIAGNOSIS — J189 Pneumonia, unspecified organism: Secondary | ICD-10-CM

## 2018-01-23 ENCOUNTER — Ambulatory Visit: Payer: BC Managed Care – PPO | Admitting: Family Medicine

## 2018-01-23 ENCOUNTER — Encounter: Payer: Self-pay | Admitting: Family Medicine

## 2018-01-23 VITALS — BP 140/95 | HR 86 | Temp 97.2°F | Ht 71.0 in | Wt 275.4 lb

## 2018-01-23 DIAGNOSIS — F5101 Primary insomnia: Secondary | ICD-10-CM

## 2018-01-23 DIAGNOSIS — I10 Essential (primary) hypertension: Secondary | ICD-10-CM

## 2018-01-23 MED ORDER — CLONAZEPAM 0.5 MG PO TABS: ORAL_TABLET | ORAL | 1 refills | Status: DC

## 2018-01-23 MED ORDER — AMLODIPINE BESYLATE 2.5 MG PO TABS: 2.5000 mg | ORAL_TABLET | Freq: Every day | ORAL | 3 refills | Status: DC

## 2018-01-23 NOTE — Patient Instructions (Signed)
Great to see you!  Stop lisinopril, start amlodipine  1 pill daily.   Come back to see Eugene Love in 1 month for follow up HTN

## 2018-01-23 NOTE — Progress Notes (Signed)
   HPI  Patient presents today for follow-up chronic medical conditions.  Hypertension Good medication compliance, however he feels the lisinopril may be causing chronic cough. He like to try something different.  Insomnia Patient's had difficulty since high school, he is tried trazodone, Ambien, and over-the-counter medications  Ambien caused too deep of sleep, trazodone caused morning somnolence. Patient has done well with Klonopin in the past, needs refill.   PMH: Smoking status noted ROS: Per HPI  Objective: BP (!) 140/95   Pulse 86   Temp (!) 97.2 F (36.2 C) (Oral)   Ht  (1.803 m)   Wt 275 lb 6.4 oz (124.9 kg)   BMI 38.41 kg/m  Gen: NAD, alert, cooperative with exam HEENT: NCAT CV: RRR, good S1/S2, no murmur Resp: CTABL, no wheezes, non-labored Abd: SNTND, BS present, no guarding or organomegaly Ext: No edema, warm Neuro: Alert and oriented, No gross deficits  Assessment and plan:  #Hypertension Patient currently on 5 mg of lisinopril and feels it is giving him a cough. Discontinue, start 2.5 mg amlodipine Follow-up 1 to 2 months with PCP  #Primary insomnia Patient tried multiple medications in the past, he uses Klonopin intermittently. Refill     Meds ordered this encounter  Medications  . clonazePAM (KLONOPIN) 0.5 MG tablet    Sig: TAKE ONE (1) TABLET AT BEDTIME    Dispense:  60 tablet    Refill:  1  . amLODipine (NORVASC) 2.5 MG tablet    Sig: Take 1 tablet (2.5 mg total) by mouth daily.    Dispense:  90 tablet    Refill:  3    Murtis Sink, MD [facility] 01/23/2018, 4:43 PM

## 2018-02-23 ENCOUNTER — Ambulatory Visit: Payer: BC Managed Care – PPO | Admitting: Physician Assistant

## 2018-02-23 ENCOUNTER — Ambulatory Visit (INDEPENDENT_AMBULATORY_CARE_PROVIDER_SITE_OTHER): Payer: BC Managed Care – PPO

## 2018-02-23 ENCOUNTER — Encounter: Payer: Self-pay | Admitting: Physician Assistant

## 2018-02-23 VITALS — BP 130/84 | HR 81 | Temp 97.2°F | Ht 71.0 in | Wt 279.0 lb

## 2018-02-23 DIAGNOSIS — R252 Cramp and spasm: Secondary | ICD-10-CM

## 2018-02-23 DIAGNOSIS — M545 Low back pain, unspecified: Secondary | ICD-10-CM

## 2018-02-23 DIAGNOSIS — M79604 Pain in right leg: Secondary | ICD-10-CM

## 2018-02-23 MED ORDER — METHYLPREDNISOLONE ACETATE 80 MG/ML IJ SUSP
80.0000 mg | Freq: Once | INTRAMUSCULAR | Status: AC
Start: 2018-02-23 — End: 2018-02-23
  Administered 2018-02-23: 80 mg via INTRAMUSCULAR

## 2018-02-23 MED ORDER — CYCLOBENZAPRINE HCL 10 MG PO TABS: 10.0000 mg | ORAL_TABLET | Freq: Three times a day (TID) | ORAL | 0 refills | Status: DC | PRN

## 2018-02-23 MED ORDER — DICLOFENAC SODIUM 75 MG PO TBEC: 75.0000 mg | DELAYED_RELEASE_TABLET | Freq: Two times a day (BID) | ORAL | 0 refills | Status: DC

## 2018-02-23 NOTE — Progress Notes (Signed)
BP 130/84   Pulse 81   Temp (!) 97.2 F (36.2 C) (Oral)   Ht 5\' 11"  (1.803 m)   Wt 279 lb (126.6 kg)   BMI 38.91 kg/m    Subjective:    Patient ID: Eugene Love, male    DOB: 1985/08/31, 32 y.o.   MRN: [id number]  HPI: Eugene Love is a 32 y.o. male presenting on 02/23/2018 for Leg Pain (from gluteal all the way down to foot-right side ) He comes in with more than a month of lumbar pain, mainly starts in the right gluteal area and radiates down the leg. It will go to his toes. Only sitting gives him relief.  He hurts more with standing and walking.  Nothing has helped him get relief.    Past Medical History:  Diagnosis Date  . Cellulitis    inner thigh   . History of right flank pain   . Hypertension    Relevant past medical, surgical, family and social history reviewed and updated as indicated. Interim medical history since our last visit reviewed. Allergies and medications reviewed and updated. DATA REVIEWED: CHART IN EPIC  Family History reviewed for pertinent findings.  Review of Systems  Constitutional: Negative.  Negative for appetite change and fatigue.  HENT: Negative.   Eyes: Negative.  Negative for pain and visual disturbance.  Respiratory: Negative.  Negative for cough, chest tightness, shortness of breath and wheezing.   Cardiovascular: Negative.  Negative for chest pain, palpitations and leg swelling.  Gastrointestinal: Negative.  Negative for abdominal pain, diarrhea, nausea and vomiting.  Endocrine: Negative.   Genitourinary: Negative.   Musculoskeletal: Positive for arthralgias, back pain, gait problem and myalgias.  Skin: Negative.  Negative for color change and rash.  Neurological: Negative for weakness, numbness and headaches.  Psychiatric/Behavioral: Negative.     Allergies as of 02/23/2018   No Known Allergies     Medication List        Accurate as of 02/23/18  4:31 PM. Always use your most recent med list.          amLODipine 2.5 MG  tablet Commonly known as:  NORVASC Take 1 tablet (2.5 mg total) by mouth daily.   clonazePAM 0.5 MG tablet Commonly known as:  KLONOPIN TAKE ONE (1) TABLET AT BEDTIME   cyclobenzaprine 10 MG tablet Commonly known as:  FLEXERIL Take 1 tablet (10 mg total) by mouth 3 (three) times daily as needed for muscle spasms.   diclofenac 75 MG EC tablet Commonly known as:  VOLTAREN Take 1 tablet (75 mg total) by mouth 2 (two) times daily.   multivitamin with minerals Tabs tablet Take 1 tablet by mouth daily. Men's Health Multivitamin   valACYclovir 1000 MG tablet Commonly known as:  VALTREX Take 1 tablet (1,000 mg total) by mouth 2 (two) times daily. After 10 days take one daily as prevention          Objective:    BP 130/84   Pulse 81   Temp (!) 97.2 F (36.2 C) (Oral)   Ht 5\' 11"  (1.803 m)   Wt 279 lb (126.6 kg)   BMI 38.91 kg/m   No Known Allergies  Wt Readings from Last 3 Encounters:  02/23/18 279 lb (126.6 kg)  01/23/18 275 lb 6.4 oz (124.9 kg)  07/28/17 269 lb (122 kg)    Physical Exam  Constitutional: He appears well-developed and well-nourished. No distress.  HENT:  Head: Normocephalic and atraumatic.  Eyes: Pupils are equal, round,  and reactive to light. Conjunctivae and EOM are normal.  Cardiovascular: Normal rate, regular rhythm and normal heart sounds.  Pulmonary/Chest: Effort normal and breath sounds normal. No respiratory distress.  Musculoskeletal:       Lumbar back: He exhibits decreased range of motion, tenderness, pain and spasm.  Neurological:  Reflex Scores:      Patellar reflexes are 1+ on the right side and 3+ on the left side. Skin: Skin is warm and dry.  Psychiatric: He has a normal mood and affect. His behavior is normal.  Nursing note and vitals reviewed.       Assessment & Plan:   1. Lumbar pain with radiation down right leg - DG Lumbar Spine 2-3 Views; Future - methylPREDNISolone acetate (DEPO-MEDROL) injection 80 mg  2. Spasm -  methylPREDNISolone acetate (DEPO-MEDROL) injection 80 mg   Continue all other maintenance medications as listed above.  Follow up plan: Return if symptoms worsen or fail to improve.  Educational handout given for survey  Remus LofflerAngel S. Jeydan Barner PA-C [facility] [address]  [place], [place] [address] [phone]   02/23/2018, 4:31 PM

## 2018-02-23 NOTE — Patient Instructions (Signed)

## 2018-03-24 ENCOUNTER — Encounter: Payer: Self-pay | Admitting: Physician Assistant

## 2018-03-24 ENCOUNTER — Ambulatory Visit: Payer: BC Managed Care – PPO | Admitting: Physician Assistant

## 2018-03-24 VITALS — BP 143/98 | HR 102 | Temp 98.3°F | Ht 71.0 in | Wt 275.2 lb

## 2018-03-24 DIAGNOSIS — M545 Low back pain: Secondary | ICD-10-CM

## 2018-03-24 DIAGNOSIS — I10 Essential (primary) hypertension: Secondary | ICD-10-CM | POA: Diagnosis not present

## 2018-03-24 DIAGNOSIS — M79604 Pain in right leg: Secondary | ICD-10-CM

## 2018-03-24 MED ORDER — TIZANIDINE HCL 4 MG PO TABS: 4.0000 mg | ORAL_TABLET | Freq: Four times a day (QID) | ORAL | 0 refills | Status: DC | PRN

## 2018-03-24 MED ORDER — AMLODIPINE BESYLATE 10 MG PO TABS: 10.0000 mg | ORAL_TABLET | Freq: Every day | ORAL | 2 refills | Status: DC

## 2018-03-24 MED ORDER — PREDNISONE 10 MG (48) PO TBPK: ORAL_TABLET | ORAL | 0 refills | Status: DC

## 2018-03-25 NOTE — Progress Notes (Signed)
BP (!) 143/98   Pulse (!) 102   Temp 98.3 F (36.8 C) (Oral)   Ht 5\' 11"  (1.803 m)   Wt 275 lb 3.2 oz (124.8 kg)   BMI 38.38 kg/m    Subjective:    Patient ID: Eugene Love, male    DOB: 1985-09-22, 32 y.o.   MRN: [id number]  HPI: Eugene Love is a 32 y.o. male presenting on 03/24/2018 for Hypertension (2 month follow up ) and Leg Pain (right )  This patient comes in for follow-up on his hypertension and increased right leg pain.  He has been taking amlodipine 2.5 mg 1 daily for a little while.  He has not had any complaints with it.  His blood pressure is not improved enough.  He is also having continued low back and right sciatic distribution pain.  He has not fallen or hurt himself anymore.  He has had to sleep on the couch recently due to some other family health issues.  He states that he is gotten much sore after that.  The pain is quite severe at times.  He has a hard time sitting still for very long he has been taking some anti-inflammatory for this.   Past Medical History:  Diagnosis Date  . Cellulitis    inner thigh   . History of right flank pain   . Hypertension    Relevant past medical, surgical, family and social history reviewed and updated as indicated. Interim medical history since our last visit reviewed. Allergies and medications reviewed and updated. DATA REVIEWED: CHART IN EPIC  Family History reviewed for pertinent findings.  Review of Systems  Constitutional: Negative.  Negative for appetite change and fatigue.  HENT: Negative.   Eyes: Negative.  Negative for pain and visual disturbance.  Respiratory: Negative.  Negative for cough, chest tightness, shortness of breath and wheezing.   Cardiovascular: Negative.  Negative for chest pain, palpitations and leg swelling.  Gastrointestinal: Negative.  Negative for abdominal pain, diarrhea, nausea and vomiting.  Endocrine: Negative.   Genitourinary: Negative.   Musculoskeletal: Positive for arthralgias, back  pain and gait problem.  Skin: Negative.  Negative for color change and rash.  Neurological: Positive for weakness. Negative for numbness and headaches.  Psychiatric/Behavioral: Negative.     Allergies as of 03/24/2018   No Known Allergies     Medication List        Accurate as of 03/24/18 11:59 PM. Always use your most recent med list.          amLODipine 10 MG tablet Commonly known as:  NORVASC Take 1 tablet (10 mg total) by mouth daily.   clonazePAM 0.5 MG tablet Commonly known as:  KLONOPIN TAKE ONE (1) TABLET AT BEDTIME   diclofenac 75 MG EC tablet Commonly known as:  VOLTAREN Take 1 tablet (75 mg total) by mouth 2 (two) times daily.   multivitamin with minerals Tabs tablet Take 1 tablet by mouth daily. Men's Health Multivitamin   predniSONE 10 MG (48) Tbpk tablet Commonly known as:  STERAPRED UNI-PAK 48 TAB Take as directed 12 days   tiZANidine 4 MG tablet Commonly known as:  ZANAFLEX Take 1 tablet (4 mg total) by mouth every 6 (six) hours as needed for muscle spasms.   valACYclovir 1000 MG tablet Commonly known as:  VALTREX Take 1 tablet (1,000 mg total) by mouth 2 (two) times daily. After 10 days take one daily as prevention  Objective:    BP (!) 143/98   Pulse (!) 102   Temp 98.3 F (36.8 C) (Oral)   Ht 5\' 11"  (1.803 m)   Wt 275 lb 3.2 oz (124.8 kg)   BMI 38.38 kg/m   No Known Allergies  Wt Readings from Last 3 Encounters:  03/24/18 275 lb 3.2 oz (124.8 kg)  02/23/18 279 lb (126.6 kg)  01/23/18 275 lb 6.4 oz (124.9 kg)    Physical Exam  Constitutional: He appears well-developed and well-nourished. No distress.  HENT:  Head: Normocephalic and atraumatic.  Eyes: Pupils are equal, round, and reactive to light. Conjunctivae and EOM are normal.  Cardiovascular: Normal rate, regular rhythm and normal heart sounds.  Pulmonary/Chest: Effort normal and breath sounds normal. No respiratory distress.  Musculoskeletal:       Lumbar back: He  exhibits decreased range of motion, pain and spasm.       Back:  Skin: Skin is warm and dry.  Psychiatric: He has a normal mood and affect. His behavior is normal.  Nursing note and vitals reviewed.   Results for orders placed or performed during the hospital encounter of 01/03/17  CBC  Result Value Ref Range   WBC 6.7 4.0 - 10.5 K/uL   RBC 4.90 4.22 - 5.81 MIL/uL   Hemoglobin 14.1 13.0 - 17.0 g/dL   HCT 09.842.8 11.939.0 - 14.752.0 %   MCV 87.3 78.0 - 100.0 fL   MCH 28.8 26.0 - 34.0 pg   MCHC 32.9 30.0 - 36.0 g/dL   RDW 82.911.7 56.211.5 - 13.015.5 %   Platelets 277 150 - 400 K/uL      Assessment & Plan:   1. Lumbar pain with radiation down right leg - amLODipine (NORVASC) 10 MG tablet; Take 1 tablet (10 mg total) by mouth daily.  Dispense: 30 tablet; Refill: 2 - tiZANidine (ZANAFLEX) 4 MG tablet; Take 1 tablet (4 mg total) by mouth every 6 (six) hours as needed for muscle spasms.  Dispense: 30 tablet; Refill: 0 - predniSONE (STERAPRED UNI-PAK 48 TAB) 10 MG (48) TBPK tablet; Take as directed 12 days  Dispense: 48 tablet; Refill: 0  2. Essential hypertension - amLODipine (NORVASC) 10 MG tablet; Take 1 tablet (10 mg total) by mouth daily.  Dispense: 30 tablet; Refill: 2   Continue all other maintenance medications as listed above.  Follow up plan: Return in about 1 month (around 04/21/2018) for recheck.  Educational handout given for survey  Remus LofflerAngel S. Donovyn Guidice PA-C [facility] [address]  [place], [place] [address] [phone]   03/25/2018, 1:39 PM

## 2018-03-29 ENCOUNTER — Other Ambulatory Visit: Payer: Self-pay

## 2018-03-29 ENCOUNTER — Emergency Department (HOSPITAL_BASED_OUTPATIENT_CLINIC_OR_DEPARTMENT_OTHER): Payer: BC Managed Care – PPO

## 2018-03-29 ENCOUNTER — Emergency Department (HOSPITAL_BASED_OUTPATIENT_CLINIC_OR_DEPARTMENT_OTHER)
Admission: EM | Admit: 2018-03-29 | Discharge: 2018-03-29 | Disposition: A | Discharge status: Home / Self Care | Payer: BC Managed Care – PPO | Attending: Emergency Medicine | Admitting: Emergency Medicine

## 2018-03-29 ENCOUNTER — Encounter (HOSPITAL_BASED_OUTPATIENT_CLINIC_OR_DEPARTMENT_OTHER): Payer: Self-pay | Admitting: Emergency Medicine

## 2018-03-29 DIAGNOSIS — Z79899 Other long term (current) drug therapy: Secondary | ICD-10-CM | POA: Diagnosis not present

## 2018-03-29 DIAGNOSIS — R079 Chest pain, unspecified: Secondary | ICD-10-CM | POA: Diagnosis present

## 2018-03-29 DIAGNOSIS — I10 Essential (primary) hypertension: Secondary | ICD-10-CM | POA: Diagnosis not present

## 2018-03-29 DIAGNOSIS — R0789 Other chest pain: Secondary | ICD-10-CM

## 2018-03-29 LAB — TROPONIN I
Troponin I: <0.03 ng/mL (ref <0.03)
Troponin I: <0.03 ng/mL (ref <0.03)

## 2018-03-29 LAB — BASIC METABOLIC PANEL
Anion gap: 11 (ref 5–15)
BUN: 15 mg/dL (ref 6–20)
CALCIUM: 9.3 mg/dL (ref 8.9–10.3)
CO2: 23 mmol/L (ref 22–32)
CREATININE: 0.92 mg/dL (ref 0.61–1.24)
Chloride: 105 mmol/L (ref 98–111)
GFR calc Af Amer: >60 mL/min (ref >60)
GFR calc non Af Amer: >60 mL/min (ref >60)
Glucose, Bld: 103 mg/dL — ABNORMAL HIGH (ref 70–99)
Potassium: 3.9 mmol/L (ref 3.5–5.1)
SODIUM: 139 mmol/L (ref 135–145)

## 2018-03-29 LAB — CBC
HCT: 50 % (ref 39.0–52.0)
Hemoglobin: 17.1 g/dL — ABNORMAL HIGH (ref 13.0–17.0)
MCH: 29.8 pg (ref 26.0–34.0)
MCHC: 34.2 g/dL (ref 30.0–36.0)
MCV: 87.1 fL (ref 78.0–100.0)
Platelets: 321 10*3/uL (ref 150–400)
RBC: 5.74 MIL/uL (ref 4.22–5.81)
RDW: 12.2 % (ref 11.5–15.5)
WBC: 11.2 10*3/uL — AB (ref 4.0–10.5)

## 2018-03-29 LAB — D-DIMER, QUANTITATIVE: D-Dimer, Quant: <0.27 ug/mL-FEU (ref 0.00–0.50)

## 2018-03-29 MED ORDER — IBUPROFEN 800 MG PO TABS
800.0000 mg | ORAL_TABLET | Freq: Once | ORAL | Status: AC
  Administered 2018-03-29: 800 mg via ORAL
  Filled 2018-03-29: qty 1

## 2018-03-29 MED ORDER — HYDROCHLOROTHIAZIDE 25 MG PO TABS
25.0000 mg | ORAL_TABLET | Freq: Once | ORAL | Status: AC
  Administered 2018-03-29: 25 mg via ORAL
  Filled 2018-03-29: qty 1

## 2018-03-29 MED ORDER — IBUPROFEN 800 MG PO TABS: 800.0000 mg | ORAL_TABLET | Freq: Three times a day (TID) | ORAL | 0 refills | Status: DC

## 2018-03-29 MED ORDER — HYDROCHLOROTHIAZIDE 25 MG PO TABS: 25.0000 mg | ORAL_TABLET | Freq: Every day | ORAL | 0 refills | Status: DC

## 2018-03-29 NOTE — ED Triage Notes (Signed)
Patient states that he was seen at an urgent care just prior to arrival. The patient reports that he was sent here for further lab tests  - patient states that his chest hurts on the left side and left shoulder and neck - the patient states that he has some numbness to his left arm

## 2018-03-29 NOTE — ED Provider Notes (Signed)
[facility] EMERGENCY DEPARTMENT Provider Note   CSN: [id number] Arrival date & time: 03/29/18  1352     History   Chief Complaint Chief Complaint  Patient presents with  . Chest Pain    HPI Eugene Love is a 32 y.o. male hx of HTN, here presenting with chest pain.  Patient has intermittent left-sided chest pain radiated to his left upper back and left arm starting yesterday.  States that it is worse with movement but denies any pleuritic pain.  Some associated shortness of breath as well.  Patient is currently on prednisone and Zanaflex for his lower back pain.  Patient denies any travel history of blood clots or history of CAD.  Patient went to urgent care was sent here for evaluation.  Of note, patient was recently started on amlodipine for hypertension and his blood pressure was 143/98 in the office last week and his norvasc was increased from 5 mg to 10 mg. He was noted to be hypertensive 160/120 at urgent care today.   The history is provided by the patient.    Past Medical History:  Diagnosis Date  . Cellulitis    inner thigh   . History of right flank pain   . Hypertension     Patient Active Problem List   Diagnosis Date Noted  . Primary insomnia 01/23/2018  . Herpes 02/25/2017  . Essential hypertension 02/25/2017    Past Surgical History:  Procedure Laterality Date  . CYSTOSCOPY WITH RETROGRADE PYELOGRAM, URETEROSCOPY AND STENT PLACEMENT Right 01/06/2017   Procedure: CYSTOSCOPY WITH RIGHT RETROGRADE PYELOGRAM, URETEROSCOPY WITH STONE REMOVAL;  Surgeon: Heloise Purpura, MD;  Location: [facility];  Service: Urology;  Laterality: Right;  . HOLMIUM LASER APPLICATION Right 01/06/2017   Procedure: HOLMIUM LASER APPLICATION;  Surgeon: Heloise Purpura, MD;  Location: [facility];  Service: Urology;  Laterality: Right;  . KNEE ARTHROTOMY    . URETHRAL DILATION          Home Medications    Prior to Admission medications   Medication Sig Start Date End Date Taking?  Authorizing Provider  amLODipine (NORVASC) 10 MG tablet Take 1 tablet (10 mg total) by mouth daily. 03/24/18   Remus Loffler, PA-C  clonazePAM (KLONOPIN) 0.5 MG tablet TAKE ONE (1) TABLET AT BEDTIME 01/23/18   Elenora Gamma, MD  diclofenac (VOLTAREN) 75 MG EC tablet Take 1 tablet (75 mg total) by mouth 2 (two) times daily. 02/23/18   Remus Loffler, PA-C  Multiple Vitamin (MULTIVITAMIN WITH MINERALS) TABS tablet Take 1 tablet by mouth daily. Men's Health Multivitamin    [provider]  predniSONE (STERAPRED UNI-PAK 48 TAB) 10 MG (48) TBPK tablet Take as directed 12 days 03/24/18   Remus Loffler, PA-C  tiZANidine (ZANAFLEX) 4 MG tablet Take 1 tablet (4 mg total) by mouth every 6 (six) hours as needed for muscle spasms. 03/24/18   Remus Loffler, PA-C  valACYclovir (VALTREX) 1000 MG tablet Take 1 tablet (1,000 mg total) by mouth 2 (two) times daily. After 10 days take one daily as prevention 02/24/17   Remus Loffler, PA-C    Family History History reviewed. No pertinent family history.  Social History Social History   Tobacco Use  . Smoking status: Never Smoker  . Smokeless tobacco: Current User    Types: Snuff  Substance Use Topics  . Alcohol use: Yes    Comment: 6 beers over the last month   . Drug use: No  Allergies   Patient has no known allergies.   Review of Systems Review of Systems  Cardiovascular: Positive for chest pain.  All other systems reviewed and are negative.    Physical Exam Updated Vital Signs BP (!) 154/124 (BP Location: Right Arm)   Pulse 93   Temp 99.1 F (37.3 C) (Oral)   Resp 20   Ht 5\' 11"  (1.803 m)   Wt 124.7 kg (275 lb)   SpO2 100%   BMI 38.35 kg/m   Physical Exam  Constitutional: He is oriented to person, place, and time. He appears well-developed and well-nourished.  HENT:  Head: Normocephalic.  Eyes: Pupils are equal, round, and reactive to light.  Neck: Normal range of motion.  Cardiovascular: Normal rate and normal  pulses. An irregularly irregular rhythm present.  Pulmonary/Chest: Effort normal and breath sounds normal.  + reproducible L chest tenderness   Abdominal: Soft. Bowel sounds are normal.  Musculoskeletal: Normal range of motion.       Right lower leg: Normal. He exhibits no tenderness and no edema.       Left lower leg: Normal. He exhibits no tenderness and no edema.  Neurological: He is alert and oriented to person, place, and time.  Skin: Skin is warm. Capillary refill takes less than 2 seconds.  Psychiatric: He has a normal mood and affect. His behavior is normal.  Nursing note and vitals reviewed.    ED Treatments / Results  Labs (all labs ordered are listed, but only abnormal results are displayed) Labs Reviewed  BASIC METABOLIC PANEL - Abnormal; Notable for the following components:      Result Value   Glucose, Bld 103 (*)    All other components within normal limits  CBC - Abnormal; Notable for the following components:   WBC 11.2 (*)    Hemoglobin 17.1 (*)    All other components within normal limits  TROPONIN I  D-DIMER, QUANTITATIVE (NOT AT [facility])    EKG None  Radiology Dg Chest 2 View  Result Date: 03/29/2018 CLINICAL DATA:  Left-sided chest pain radiating into the left shoulder today. History of hypertension. EXAM: CHEST - 2 VIEW COMPARISON:  Limited comparison made with abdominal CT 12/26/2016. FINDINGS: The heart size and mediastinal contours are normal. The lungs are clear. There is no pleural effusion or pneumothorax. No acute osseous findings are identified. IMPRESSION: No active cardiopulmonary process. Electronically Signed   By: Carey BullocksWilliam  Veazey M.D.   On: 03/29/2018 14:34    Procedures Procedures (including critical care time)  Medications Ordered in ED Medications  hydrochlorothiazide (HYDRODIURIL) tablet 25 mg (has no administration in time range)  ibuprofen (ADVIL,MOTRIN) tablet 800 mg (has no administration in time range)     Initial Impression /  Assessment and Plan / ED Course  I have reviewed the triage vital signs and the nursing notes.  Pertinent labs & imaging results that were available during my care of the patient were reviewed by me and considered in my medical decision making (see chart for details).     Everardo BealsRyan Coulon is a 32 y.o. male here with chest pain, SOB. Tachycardic in urgent care around 110 but HR is normal in the ED. Pain started yesterday. Hx of HTN and is hypertensive in the ED. I think likely symptomatic hypertension vs ACS vs PE but likely MSK pain. Low suspicion for dissection. Will get labs, trop x 2, d-dimer, CXR. Will give motrin for pain.    6:47 PM Trop neg x 2. D-dimer  neg. CXR clear. BP remained around the 150-160s. Pain still reproducible with palpation. Patient is a [employer] manager. I think likely chest wall pain and symptomatic hypertension. Will add HCTZ, motrin. Will have him continue prednisone, zanaflex   Final Clinical Impressions(s) / ED Diagnoses   Final diagnoses:  None    ED Discharge Orders    None       Charlynne Pander, MD 03/29/18 [phone]

## 2018-03-29 NOTE — ED Notes (Signed)
Pt on auto VS  

## 2018-03-29 NOTE — Discharge Instructions (Signed)
Take motrin for pain.   Continue prednisone, xanaflex as prescribed.   Take hctz to help your blood pressure   See your doctor in 2 days to recheck your blood pressure and reassess   Return to ER if you have worse chest pain, abdominal pain, trouble breathing

## 2018-03-29 NOTE — ED Notes (Signed)
ED Provider at bedside. 

## 2018-03-30 ENCOUNTER — Encounter: Payer: Self-pay | Admitting: Family

## 2018-03-30 ENCOUNTER — Ambulatory Visit: Payer: BC Managed Care – PPO | Admitting: Family

## 2018-03-30 VITALS — BP 129/89 | HR 109 | Temp 98.3°F | Ht 71.0 in | Wt 273.0 lb

## 2018-03-30 DIAGNOSIS — M79604 Pain in right leg: Secondary | ICD-10-CM

## 2018-03-30 DIAGNOSIS — Z09 Encounter for follow-up examination after completed treatment for conditions other than malignant neoplasm: Secondary | ICD-10-CM | POA: Diagnosis not present

## 2018-03-30 DIAGNOSIS — R0789 Other chest pain: Secondary | ICD-10-CM | POA: Diagnosis not present

## 2018-03-30 DIAGNOSIS — M545 Low back pain: Secondary | ICD-10-CM

## 2018-03-30 MED ORDER — TIZANIDINE HCL 4 MG PO TABS: 4.0000 mg | ORAL_TABLET | Freq: Four times a day (QID) | ORAL | 0 refills | Status: DC | PRN

## 2018-03-30 MED ORDER — KETOROLAC TROMETHAMINE 60 MG/2ML IM SOLN
60.0000 mg | Freq: Once | INTRAMUSCULAR | Status: AC
  Administered 2018-03-30: 60 mg via INTRAMUSCULAR

## 2018-03-30 NOTE — Progress Notes (Signed)
   Subjective:    Patient ID: Eugene Love, male    DOB: 06/28/1986, 32 y.o.   MRN: [id number]  Chief Complaint  Patient presents with  . Chest Pain   Pt presents to the office today with recurrent chest pain. Pt went to the ED yesterday with this pain. He had negative trop X2, D-dimer negative, chest x-ray negative. They believe this was more MSK related. He is already taking prednisone and zanaflex for sciatica pain.  Chest Pain   This is a recurrent problem. The current episode started in the past 7 days. The problem occurs intermittently. The problem has been waxing and waning. The pain is present in the substernal region. The pain is mild. The pain radiates to the left arm. Pertinent negatives include no cough, diaphoresis, dizziness, lower extremity edema, malaise/fatigue or shortness of breath.    [facility]*Hospital notes  Review of Systems  Constitutional: Negative for diaphoresis and malaise/fatigue.  Respiratory: Negative for cough and shortness of breath.   Cardiovascular: Positive for chest pain.  Neurological: Negative for dizziness.  All other systems reviewed and are negative.      Objective:   Physical Exam  Constitutional: He is oriented to person, place, and time. He appears well-developed and well-nourished. No distress.  HENT:  Head: Normocephalic.  Right Ear: External ear normal.  Left Ear: External ear normal.  Mouth/Throat: Oropharynx is clear and moist.  Eyes: Pupils are equal, round, and reactive to light. Right eye exhibits no discharge. Left eye exhibits no discharge.  Neck: Normal range of motion. Neck supple. No thyromegaly present.  Cardiovascular: Normal rate, regular rhythm, normal heart sounds and intact distal pulses.  No murmur heard. Pulmonary/Chest: Effort normal and breath sounds normal. No respiratory distress. He has no wheezes.  Abdominal: Soft. Bowel sounds are normal. He exhibits no distension. There is no tenderness.  Musculoskeletal: Normal  range of motion. He exhibits no edema or tenderness.  MSK Chest pain when he lifts left arm or with extension  Neurological: He is alert and oriented to person, place, and time. He has normal reflexes. No cranial nerve deficit.  Skin: Skin is warm and dry. No rash noted. No erythema.  Psychiatric: He has a normal mood and affect. His behavior is normal. Judgment and thought content normal.  Vitals reviewed.     BP 129/89   Pulse (!) 109   Temp 98.3 F (36.8 C) (Oral)   Ht 5\' 11"  (1.803 m)   Wt 273 lb (123.8 kg)   BMI 38.08 kg/m      Assessment & Plan:  Eugene Love comes in today with chief complaint of Chest Pain   Diagnosis and orders addressed:  1. Acute chest wall pain Rest Ice Start Motrin 800 mg TID and start Zanaflex TID Go to ED if chest pain worsens or new SOB - tiZANidine (ZANAFLEX) 4 MG tablet; Take 1 tablet (4 mg total) by mouth every 6 (six) hours as needed for muscle spasms.  Dispense: 30 tablet; Refill: 0 - ketorolac (TORADOL) injection 60 mg  2. Lumbar pain with radiation down right leg  3. Hospital discharge follow-up Labs reviewed    Jannifer Rodneyhristy Davari Lopes, FNP

## 2018-03-30 NOTE — Patient Instructions (Signed)
Chest Wall Pain °Chest wall pain is pain in or around the bones and muscles of your chest. Sometimes, an injury causes this pain. Sometimes, the cause may not be known. This pain may take several weeks or longer to get better. °Follow these instructions at home: °Pay attention to any changes in your symptoms. Take these actions to help with your pain: °· Rest as told by your health care provider. °· Avoid activities that cause pain. These include any activities that use your chest muscles or your abdominal and side muscles to lift heavy items. °· If directed, apply ice to the painful area: °? Put ice in a plastic bag. °? Place a towel between your skin and the bag. °? Leave the ice on for 20 minutes, 2-3 times per day. °· Take over-the-counter and prescription medicines only as told by your health care provider. °· Do not use tobacco products, including cigarettes, chewing tobacco, and e-cigarettes. If you need help quitting, ask your health care provider. °· Keep all follow-up visits as told by your health care provider. This is important. ° °Contact a health care provider if: °· You have a fever. °· Your chest pain becomes worse. °· You have new symptoms. °Get help right away if: °· You have nausea or vomiting. °· You feel sweaty or light-headed. °· You have a cough with phlegm (sputum) or you cough up blood. °· You develop shortness of breath. °This information is not intended to replace advice given to you by your health care provider. Make sure you discuss any questions you have with your health care provider. °Document Released: 08/12/2005 Document Revised: 12/21/2015 Document Reviewed: 11/07/2014 °Elsevier Interactive Patient Education © 2018 [employer] ° °

## 2018-04-02 ENCOUNTER — Telehealth: Payer: Self-pay | Admitting: Physician Assistant

## 2018-04-02 DIAGNOSIS — R079 Chest pain, unspecified: Secondary | ICD-10-CM

## 2018-04-02 NOTE — Telephone Encounter (Signed)
Is he still having chest pain? We can send to Cardiologists to rule out.

## 2018-04-03 ENCOUNTER — Telehealth: Payer: Self-pay | Admitting: Physician Assistant

## 2018-04-03 DIAGNOSIS — R937 Abnormal findings on diagnostic imaging of other parts of musculoskeletal system: Secondary | ICD-10-CM

## 2018-04-03 DIAGNOSIS — M79604 Pain in right leg: Secondary | ICD-10-CM

## 2018-04-03 DIAGNOSIS — M545 Low back pain: Secondary | ICD-10-CM

## 2018-04-03 NOTE — Telephone Encounter (Signed)
Pt calling and saying his back is still hurting and he would like referral to [facility]. Referral placed.

## 2018-04-08 ENCOUNTER — Other Ambulatory Visit: Payer: Self-pay | Admitting: Specialist

## 2018-04-08 DIAGNOSIS — G8929 Other chronic pain: Secondary | ICD-10-CM

## 2018-04-08 DIAGNOSIS — M545 Low back pain: Principal | ICD-10-CM

## 2018-04-22 ENCOUNTER — Ambulatory Visit: Payer: BC Managed Care – PPO | Attending: Orthopedic Surgery | Admitting: Physical Therapy

## 2018-04-22 ENCOUNTER — Other Ambulatory Visit: Payer: Self-pay

## 2018-04-22 ENCOUNTER — Encounter: Payer: Self-pay | Admitting: Physical Therapy

## 2018-04-22 DIAGNOSIS — M5412 Radiculopathy, cervical region: Secondary | ICD-10-CM | POA: Diagnosis not present

## 2018-04-22 DIAGNOSIS — M542 Cervicalgia: Secondary | ICD-10-CM | POA: Diagnosis present

## 2018-04-22 DIAGNOSIS — R293 Abnormal posture: Secondary | ICD-10-CM | POA: Insufficient documentation

## 2018-04-22 NOTE — Therapy (Signed)
[facility] [address] [place], [place], [address] Phone: [phone]   Fax:  [phone]  Physical Therapy Evaluation  Patient Details  Name: Tayt Moyers MRN: [id number] Date of Birth: 1986-07-02 Referring Provider: Venita Lick, MD   Encounter Date: 04/22/2018  PT End of Session - 04/22/18 0905    Visit Number  1    Number of Visits  12    Date for PT Re-Evaluation  06/10/18    Authorization Type  FOTO every 5th visit, progress note every 10th visit    PT Start Time  0816    PT Stop Time  0900    PT Time Calculation (min)  44 min    Activity Tolerance  Patient limited by pain    Behavior During Therapy  [facility] for tasks assessed/performed       Past Medical History:  Diagnosis Date  . Cellulitis    inner thigh   . History of right flank pain   . Hypertension     Past Surgical History:  Procedure Laterality Date  . CYSTOSCOPY WITH RETROGRADE PYELOGRAM, URETEROSCOPY AND STENT PLACEMENT Right 01/06/2017   Procedure: CYSTOSCOPY WITH RIGHT RETROGRADE PYELOGRAM, URETEROSCOPY WITH STONE REMOVAL;  Surgeon: Heloise Purpura, MD;  Location: [facility];  Service: Urology;  Laterality: Right;  . HOLMIUM LASER APPLICATION Right 01/06/2017   Procedure: HOLMIUM LASER APPLICATION;  Surgeon: Heloise Purpura, MD;  Location: [facility];  Service: Urology;  Laterality: Right;  . KNEE ARTHROTOMY    . URETHRAL DILATION      There were no vitals filed for this visit.   Subjective Assessment - 04/22/18 1026    Subjective  Patient arrives to physical therapy with reports of cervical pain with numbness and stabbing that refers to left anterior chest and down left elbow. Patient reports pain and symptoms began about 2-3 months ago with no known cause. Patient reports MRI stated 3 bulging discs in cervical spine. Patient reports loss of left UE strength and increase intensity of stabbing pain with neck and shoulder movements. Patient reported having epidural  injections the week of Apr 13, 2018 with no relief of pain. Patient reports pain is constantly at 8/10 with no relief of pain with pain medication. Patient stated pain is preventing him from having greater than 4 hours of uninterrupted sleep. Patient is able to perform ADLs but notices it requires a little more effort to perform with the left UE. Patient's goal is to reduce pain, improve movement, and return to PLOF.    Pertinent History  HTN    Limitations  Sitting;Reading;Lifting;Standing;House hold activities    Diagnostic tests  MRI, X-Ray    Patient Stated Goals  reduce pain and get back to normal    Currently in Pain?  Yes    Pain Score  8     Pain Location  Neck    Pain Orientation  Left    Pain Descriptors / Indicators  Stabbing;Constant    Pain Type  Chronic pain    Pain Radiating Towards  anterior left chest to L elbow    Pain Onset  More than a month ago    Pain Frequency  Constant    Aggravating Factors   movement    Pain Relieving Factors  nothing         [facility] PT Assessment - 04/22/18 0001      Assessment   Medical Diagnosis  Degeneration of cervical intervertebral disc     Referring Provider  Venita Lick,  MD    Onset Date/Surgical Date  --   ongoing, 2-3 months ago   Hand Dominance  Right    Next MD Visit  Sept 3, 2019    Prior Therapy  no      Precautions   Precautions  None      Restrictions   Weight Bearing Restrictions  No      Balance Screen   Has the patient fallen in the past 6 months  No    Has the patient had a decrease in activity level because of a fear of falling?   No    Is the patient reluctant to leave their home because of a fear of falling?   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     Observation/Other Assessments   Focus on  Therapeutic Outcomes (FOTO)   52% limited      Sensation   Light Touch  Impaired by gross assessment   hypersensitivity to L triceps     Posture/Postural Control   Posture/Postural Control  Postural limitations    Postural Limitations  Rounded Shoulders;Forward head;Increased thoracic kyphosis   noted with cervical flexion in sitting and standing     ROM / Strength   AROM / PROM / Strength  Strength;AROM      AROM   Overall AROM Comments  bilateral shoulder AROM WNL    AROM Assessment Site  Cervical    Cervical Flexion  38    Cervical Extension  21    Cervical - Right Side Bend  20    Cervical - Left Side Bend  25    Cervical - Right Rotation  30    Cervical - Left Rotation  30      Strength   Overall Strength Comments  Pain with all MMT in anterior shoulder and triceps    Strength Assessment Site  Shoulder;Hand    Right/Left Shoulder  Left    Left Shoulder Flexion  3/5    Left Shoulder ABduction  3/5    Left Shoulder Internal Rotation  3+/5    Left Shoulder External Rotation  3+/5    Right/Left hand  Right;Left    Right Hand Grip (lbs)  95    Left Hand Grip (lbs)  45      Special Tests    Special Tests  Cervical    Other special tests  Left C5 DTR= 1+, unable to elicit C6 and C7    Cervical Tests  Spurling's;Dictraction      Spurling's   Findings  Positive    Comment  reproduction of symptoms      Distraction Test   Findngs  Positive    Comment  slight pain but no reproduction of neurological symptoms                Objective measurements completed on examination: See above findings.              PT Education - 04/22/18 1032    Education Details  chin tucks, scapular retractions, putty squeezes, proper sitting posture    Person(s) Educated  Patient    Methods  Explanation;Demonstration;Handout    Comprehension  Verbalized understanding;Returned demonstration       PT Short  Term Goals - 04/22/18 1035      PT SHORT TERM GOAL #1    Title  STG=LTG        PT Long Term Goals - 04/22/18 1035      PT LONG TERM GOAL #1   Title  Patient will be independent with HEP    Time  6    Period  Weeks    Status  New      PT LONG TERM GOAL #2   Title  Patient will demonstrate 60+ degrees of cervical rotation to safely look over shoulder while driving.    Time  6    Period  Weeks    Status  New      PT LONG TERM GOAL #3   Title  Patient will report no neurological symptoms down left UE to indicate no nerve irritation.    Time  6    Period  Weeks    Status  New      PT LONG TERM GOAL #4   Title  Patient will demonstrate 80 lbs of left grip strength or greater to improve ability to perform functional tasks.    Time  6    Period  Weeks    Status  New      PT LONG TERM GOAL #5   Title  Patient will ability to perform work activities with less than or equal to 4/10 cervical and shoulder pain.     Time  6    Period  Weeks    Status  New             Plan - 04/22/18 1032    Clinical Impression Statement  Patient is a 32 year old right handed male who presents to physical therapy with cervical pain and neurological symptoms that radiate to left elbow. Patient noted with decreased cervical AROM with reports of pain with all movements. [facility]OM and decreased L shoulder MMT in all planes secondary to pain. Patient (+) for Spurling's test with a reproduction of symptoms. Patient tender palpation to left cervical spine, UT, and triceps. C5 left biceps DTR diminished; C6 and C7 DTR unable to elicit. Patient would benefit from skilled physical therapy to address deficits and address patient's goals.     Clinical Presentation  Evolving    Clinical Presentation due to:  not improving    Clinical Decision Making  Low    Rehab Potential  Good    PT Frequency  2x / week    PT Duration  6 weeks    PT Treatment/Interventions  Neuromuscular re-education;Therapeutic exercise;Therapeutic  activities;Ultrasound;Traction;Moist Heat;Electrical Stimulation;Cryotherapy;ADLs/Self Care Home Management;Iontophoresis 4mg /ml Dexamethasone;Patient/family education;Manual techniques;Passive range of motion;Dry needling    PT Next Visit Plan  postural exercises, cervical PROM, manual traction to tolerance, E-stim for pain relief    PT Home Exercise Plan  see patient education section    Consulted and Agree with Plan of Care  Patient       Patient will benefit from skilled therapeutic intervention in order to improve the following deficits and impairments:  Pain, Impaired UE functional use, Decreased strength, Decreased range of motion, Decreased endurance, Postural dysfunction  Visit Diagnosis: Radiculopathy, cervical region  Cervicalgia  Abnormal posture     Problem List Patient Active Problem List   Diagnosis Date Noted  . Primary insomnia 01/23/2018  . Herpes 02/25/2017  . Essential hypertension 02/25/2017   Guss BundeKrystle Rosaleen Mazer, PT, DPT 04/22/2018, 11:11 AM  [facility] [address] [place], [place], [address] Phone: [phone]   Fax:  [phone]  Name: Emrik Erhard MRN: [id number] Date of Birth: [birth year]

## 2018-04-23 ENCOUNTER — Ambulatory Visit: Payer: BC Managed Care – PPO | Admitting: Physician Assistant

## 2018-04-23 ENCOUNTER — Encounter: Payer: Self-pay | Admitting: Physician Assistant

## 2018-04-23 VITALS — BP 118/80 | HR 78 | Temp 98.2°F | Ht 71.0 in | Wt 271.6 lb

## 2018-04-23 DIAGNOSIS — I10 Essential (primary) hypertension: Secondary | ICD-10-CM | POA: Diagnosis not present

## 2018-04-23 DIAGNOSIS — M5000 Cervical disc disorder with myelopathy, unspecified cervical region: Secondary | ICD-10-CM | POA: Diagnosis not present

## 2018-04-23 DIAGNOSIS — M545 Low back pain, unspecified: Secondary | ICD-10-CM

## 2018-04-23 DIAGNOSIS — M79604 Pain in right leg: Secondary | ICD-10-CM

## 2018-04-23 MED ORDER — AMLODIPINE BESYLATE 10 MG PO TABS: 10.0000 mg | ORAL_TABLET | Freq: Every day | ORAL | 3 refills | Status: DC

## 2018-04-24 ENCOUNTER — Encounter: Payer: Self-pay | Admitting: Physical Therapy

## 2018-04-24 ENCOUNTER — Ambulatory Visit: Payer: BC Managed Care – PPO | Admitting: Physical Therapy

## 2018-04-24 DIAGNOSIS — M5412 Radiculopathy, cervical region: Secondary | ICD-10-CM

## 2018-04-24 DIAGNOSIS — R293 Abnormal posture: Secondary | ICD-10-CM

## 2018-04-24 DIAGNOSIS — M542 Cervicalgia: Secondary | ICD-10-CM

## 2018-04-24 NOTE — Therapy (Signed)
[facility] [address] [place], [place], [address] Phone: [phone]   Fax:  [phone]  Physical Therapy Treatment  Patient Details  Name: Eugene Love MRN: [id number] Date of Birth: [birth year] Referring Provider: Venita Lick, MD   Encounter Date: 04/24/2018  PT End of Session - 04/24/18 0915    Visit Number  2    Number of Visits  12    Date for PT Re-Evaluation  06/10/18    Authorization Type  FOTO every 5th visit, progress note every 10th visit    PT Start Time  0815    PT Stop Time  0902    PT Time Calculation (min)  47 min    Activity Tolerance  Patient limited by pain    Behavior During Therapy  [facility] for tasks assessed/performed       Past Medical History:  Diagnosis Date  . Cellulitis    inner thigh   . History of right flank pain   . Hypertension     Past Surgical History:  Procedure Laterality Date  . CYSTOSCOPY WITH RETROGRADE PYELOGRAM, URETEROSCOPY AND STENT PLACEMENT Right 01/06/2017   Procedure: CYSTOSCOPY WITH RIGHT RETROGRADE PYELOGRAM, URETEROSCOPY WITH STONE REMOVAL;  Surgeon: Heloise Purpura, MD;  Location: [facility];  Service: Urology;  Laterality: Right;  . HOLMIUM LASER APPLICATION Right 01/06/2017   Procedure: HOLMIUM LASER APPLICATION;  Surgeon: Heloise Purpura, MD;  Location: [facility];  Service: Urology;  Laterality: Right;  . KNEE ARTHROTOMY    . URETHRAL DILATION      There were no vitals filed for this visit.  Subjective Assessment - 04/24/18 0856    Subjective  Patient reported feeling better yesterday but as the day went on it started to increase in pain again.    Pertinent History  HTN    Limitations  Sitting;Reading;Lifting;Standing;House hold activities    Diagnostic tests  MRI, X-Ray    Patient Stated Goals  reduce pain and get back to normal    Currently in Pain?  Yes    Pain Score  7     Pain Location  Neck    Pain Orientation  Left    Pain Descriptors / Indicators  Constant;Stabbing     Pain Type  Chronic pain    Pain Onset  More than a month ago    Pain Frequency  Constant         [facility] PT Assessment - 04/24/18 0001      Assessment   Medical Diagnosis  Degeneration of cervical intervertebral disc     Hand Dominance  Right                   [facility] Adult PT Treatment/Exercise - 04/24/18 0001      Exercises   Exercises  Neck      Neck Exercises: Standing   Neck Retraction  5 reps;10 secs      Manual Therapy   Manual Therapy  Soft tissue mobilization;Passive ROM;Manual Traction    Soft tissue mobilization  STW/M to cervical musculature and L UT to decrease pain and reduce muscle tension    Passive ROM  gentle PROM of cervical spine in all planes to improve ROM and manual L UT and levator scap stretch 3x20 seconds each    Manual Traction  gentle manual traction 30"  hold x5 with alternating suboccipital release               PT Short Term Goals - 04/22/18 1035  PT SHORT TERM GOAL #1   Title  STG=LTG        PT Long Term Goals - 04/22/18 1035      PT LONG TERM GOAL #1   Title  Patient will be independent with HEP    Time  6    Period  Weeks    Status  New      PT LONG TERM GOAL #2   Title  Patient will demonstrate 60+ degrees of cervical rotation to safely look over shoulder while driving.    Time  6    Period  Weeks    Status  New      PT LONG TERM GOAL #3   Title  Patient will report no neurological symptoms down left UE to indicate no nerve irritation.    Time  6    Period  Weeks    Status  New      PT LONG TERM GOAL #4   Title  Patient will demonstrate 80 lbs of left grip strength or greater to improve ability to perform functional tasks.    Time  6    Period  Weeks    Status  New      PT LONG TERM GOAL #5   Title  Patient will ability to perform work activities with less than or equal to 4/10 cervical and shoulder pain.     Time  6    Period  Weeks    Status  New            Plan - 04/24/18 0915     Clinical Impression Statement  Patient was able to tolerate fairly. Patient noted with increased pain with chin tucks and patient was instructed to tuck in to pain but not beyond. Patient was able to tolerate light manual traction but reported lying supine increases symptoms and is not the most comfortable position. Patient noted with decreased pain after STW/M and modalities.     Clinical Presentation  Evolving    Clinical Decision Making  Low    Rehab Potential  Good    PT Frequency  2x / week    PT Duration  6 weeks    PT Treatment/Interventions  Neuromuscular re-education;Therapeutic exercise;Therapeutic activities;Ultrasound;Traction;Moist Heat;Electrical Stimulation;Cryotherapy;ADLs/Self Care Home Management;Iontophoresis 4mg /ml Dexamethasone;Patient/family education;Manual techniques;Passive range of motion;Dry needling    PT Next Visit Plan  postural exercises, cervical PROM, manual traction to tolerance, E-stim for pain relief    Consulted and Agree with Plan of Care  Patient       Patient will benefit from skilled therapeutic intervention in order to improve the following deficits and impairments:  Pain, Impaired UE functional use, Decreased strength, Decreased range of motion, Decreased endurance, Postural dysfunction  Visit Diagnosis: Radiculopathy, cervical region  Cervicalgia  Abnormal posture     Problem List Patient Active Problem List   Diagnosis Date Noted  . Cervical disc disease with myelopathy 04/23/2018  . Primary insomnia 01/23/2018  . Herpes 02/25/2017  . Essential hypertension 02/25/2017    Guss BundeKrystle Colen Eltzroth, PT, DPT 04/24/2018, 11:01 AM  [facility] [address] [place], [place], [address] Phone: [phone]   Fax:  [phone]  Name: Eugene BealsRyan Love MRN: [id number] Date of Birth: 03/18/1986

## 2018-04-27 NOTE — Progress Notes (Signed)
BP 118/80   Pulse 78   Temp 98.2 F (36.8 C) (Oral)   Ht 5\' 11"  (1.803 m)   Wt 271 lb 9.6 oz (123.2 kg)   BMI 37.88 kg/m    Subjective:    Patient ID: Eugene Love, male    DOB: 1986/01/07, 32 y.o.   MRN: [id number]  HPI: Eugene Love is a 32 y.o. male presenting on 04/23/2018 for Hypertension (1 month follow up )  This is this patient comes in forFor recheck on his blood pressure.  He states he is doing very well with this medication and his blood pressure readings have been much improved.  He is currently under the treatment of physical therapy and orthopedics for his cervical and lumbar degenerative disc.  Past Medical History:  Diagnosis Date  . Cellulitis    inner thigh   . History of right flank pain   . Hypertension    Relevant past medical, surgical, family and social history reviewed and updated as indicated. Interim medical history since our last visit reviewed. Allergies and medications reviewed and updated. DATA REVIEWED: CHART IN EPIC  Family History reviewed for pertinent findings.  Review of Systems  Constitutional: Negative.  Negative for appetite change and fatigue.  HENT: Negative.   Eyes: Negative.  Negative for pain and visual disturbance.  Respiratory: Negative.  Negative for cough, chest tightness, shortness of breath and wheezing.   Cardiovascular: Negative.  Negative for chest pain, palpitations and leg swelling.  Gastrointestinal: Negative.  Negative for abdominal pain, diarrhea, nausea and vomiting.  Endocrine: Negative.   Genitourinary: Negative.   Musculoskeletal: Negative.   Skin: Negative.  Negative for color change and rash.  Neurological: Negative.  Negative for weakness, numbness and headaches.  Psychiatric/Behavioral: Negative.     Allergies as of 04/23/2018   No Known Allergies     Medication List        Accurate as of 04/23/18 11:59 PM. Always use your most recent med list.          amLODipine 10 MG tablet Commonly known as:   NORVASC Take 1 tablet (10 mg total) by mouth daily.   clonazePAM 0.5 MG tablet Commonly known as:  KLONOPIN TAKE ONE (1) TABLET AT BEDTIME          Objective:    BP 118/80   Pulse 78   Temp 98.2 F (36.8 C) (Oral)   Ht 5\' 11"  (1.803 m)   Wt 271 lb 9.6 oz (123.2 kg)   BMI 37.88 kg/m   No Known Allergies  Wt Readings from Last 3 Encounters:  04/23/18 271 lb 9.6 oz (123.2 kg)  03/30/18 273 lb (123.8 kg)  03/29/18 275 lb (124.7 kg)    Physical Exam  Constitutional: He appears well-developed and well-nourished. No distress.  HENT:  Head: Normocephalic and atraumatic.  Eyes: Pupils are equal, round, and reactive to light. Conjunctivae and EOM are normal.  Cardiovascular: Normal rate, regular rhythm and normal heart sounds.  Pulmonary/Chest: Effort normal and breath sounds normal. No respiratory distress.  Skin: Skin is warm and dry.  Psychiatric: He has a normal mood and affect. His behavior is normal.  Nursing note and vitals reviewed.   Results for orders placed or performed during the hospital encounter of 03/29/18  Basic metabolic panel  Result Value Ref Range   Sodium 139 135 - 145 mmol/L   Potassium 3.9 3.5 - 5.1 mmol/L   Chloride 105 98 - 111 mmol/L   CO2 23 22 -  32 mmol/L   Glucose, Bld 103 (H) 70 - 99 mg/dL   BUN 15 6 - 20 mg/dL   Creatinine, Ser 3.55 0.61 - 1.24 mg/dL   Calcium 9.3 8.9 - 97.4 mg/dL   GFR calc non Af Amer >60 >60 mL/min   GFR calc Af Amer >60 >60 mL/min   Anion gap 11 5 - 15  CBC  Result Value Ref Range   WBC 11.2 (H) 4.0 - 10.5 K/uL   RBC 5.74 4.22 - 5.81 MIL/uL   Hemoglobin 17.1 (H) 13.0 - 17.0 g/dL   HCT 16.3 84.5 - 36.4 %   MCV 87.1 78.0 - 100.0 fL   MCH 29.8 26.0 - 34.0 pg   MCHC 34.2 30.0 - 36.0 g/dL   RDW 68.0 32.1 - 22.4 %   Platelets 321 150 - 400 K/uL  Troponin I  Result Value Ref Range   Troponin I <0.03 <0.03 ng/mL  D-dimer, quantitative (not at [facility])  Result Value Ref Range   D-Dimer, Quant <0.27 0.00 - 0.50  ug/mL-FEU  Troponin I  Result Value Ref Range   Troponin I <0.03 <0.03 ng/mL      Assessment & Plan:   1. Lumbar pain with radiation down right leg Continue therapy with ortho and PT  2. Essential hypertension - amLODipine (NORVASC) 10 MG tablet; Take 1 tablet (10 mg total) by mouth daily.  Dispense: 90 tablet; Refill: 3  3. Cervical disc disease with myelopathy Continue therapy with ortho and PT   Continue all other maintenance medications as listed above.  Follow up plan: Return in about 6 months (around 10/24/2018).  Educational handout given for survey  Remus Loffler PA-C [facility] [address]  [place], [place] [address] [phone]   04/27/2018, 5:05 PM

## 2018-04-29 ENCOUNTER — Encounter: Payer: Self-pay | Admitting: Physical Therapy

## 2018-04-29 ENCOUNTER — Ambulatory Visit: Payer: BC Managed Care – PPO | Attending: Orthopedic Surgery | Admitting: Physical Therapy

## 2018-04-29 DIAGNOSIS — M5412 Radiculopathy, cervical region: Secondary | ICD-10-CM | POA: Diagnosis not present

## 2018-04-29 DIAGNOSIS — R293 Abnormal posture: Secondary | ICD-10-CM | POA: Insufficient documentation

## 2018-04-29 DIAGNOSIS — M542 Cervicalgia: Secondary | ICD-10-CM | POA: Insufficient documentation

## 2018-04-29 NOTE — Therapy (Signed)
[facility] [address] [place], [place], [address] Phone: [phone]   Fax:  [phone]  Physical Therapy Treatment  Patient Details  Name: Eugene Love MRN: [id number] Date of Birth: 1986/02/28 Referring Provider: Venita Lick, MD   Encounter Date: 04/29/2018  PT End of Session - 04/29/18 0739    Visit Number  3    Number of Visits  12    Date for PT Re-Evaluation  06/10/18    Authorization Type  FOTO every 5th visit, progress note every 10th visit    PT Start Time  0732    PT Stop Time  0816    PT Time Calculation (min)  44 min    Activity Tolerance  Patient tolerated treatment well    Behavior During Therapy  [facility] for tasks assessed/performed       Past Medical History:  Diagnosis Date  . Cellulitis    inner thigh   . History of right flank pain   . Hypertension     Past Surgical History:  Procedure Laterality Date  . CYSTOSCOPY WITH RETROGRADE PYELOGRAM, URETEROSCOPY AND STENT PLACEMENT Right 01/06/2017   Procedure: CYSTOSCOPY WITH RIGHT RETROGRADE PYELOGRAM, URETEROSCOPY WITH STONE REMOVAL;  Surgeon: Heloise Purpura, MD;  Location: [facility];  Service: Urology;  Laterality: Right;  . HOLMIUM LASER APPLICATION Right 01/06/2017   Procedure: HOLMIUM LASER APPLICATION;  Surgeon: Heloise Purpura, MD;  Location: [facility];  Service: Urology;  Laterality: Right;  . KNEE ARTHROTOMY    . URETHRAL DILATION      There were no vitals filed for this visit.  Subjective Assessment - 04/29/18 0733    Subjective  Reports receiving an injection yesterday and has been reportedly symptom free since yesterday afternoon.    Pertinent History  HTN    Limitations  Sitting;Reading;Lifting;Standing;House hold activities    Diagnostic tests  MRI, X-Ray    Patient Stated Goals  reduce pain and get back to normal    Currently in Pain?  No/denies         [facility] PT Assessment - 04/29/18 0001      Assessment   Medical Diagnosis  Degeneration of  cervical intervertebral disc     Next MD Visit  TBD    Prior Therapy  no      Precautions   Precautions  None      Restrictions   Weight Bearing Restrictions  No      ROM / Strength   AROM / PROM / Strength  Strength      Strength   Overall Strength  Within functional limits for tasks performed    Strength Assessment Site  Hand    Right/Left hand  Right;Left    Right Hand Grip (lbs)  125    Left Hand Grip (lbs)  115                   [facility] Adult PT Treatment/Exercise - 04/29/18 0001      Exercises   Exercises  Neck      Neck Exercises: Machines for Strengthening   UBE (Upper Arm Bike)  120 RPM x6 min      Neck Exercises: Theraband   Scapula Retraction  20 reps;Limitations    Scapula Retraction Limitations  Pink XTS    Shoulder Extension  20 reps;Limitations    Shoulder Extension Limitations  Pink XTS    Shoulder External Rotation  20 reps;Green    Horizontal ABduction  20 reps;Green  Neck Exercises: Standing   Wall Push Ups  20 reps    Upper Extremity Flexion with Stabilization  Flexion;15 reps    Upper Extremity D2  Flexion;15 reps;Theraband    Theraband Level (UE D2)  Level 2 (Red)    UE D2 Limitations  with chin tuck    Lift / Chop  20 reps    Other Standing Exercises  Wall walks       Modalities   Modalities  Traction      Traction   Type of Traction  Cervical    Min (lbs)  5    Max (lbs)  15    Hold Time  99    Rest Time  5    Time  15               PT Short Term Goals - 04/22/18 1035      PT SHORT TERM GOAL #1   Title  STG=LTG        PT Long Term Goals - 04/29/18 0837      PT LONG TERM GOAL #1   Title  Patient will be independent with HEP    Time  6    Period  Weeks    Status  On-going      PT LONG TERM GOAL #2   Title  Patient will demonstrate 60+ degrees of cervical rotation to safely look over shoulder while driving.    Time  6    Period  Weeks    Status  On-going      PT LONG TERM GOAL #3   Title   Patient will report no neurological symptoms down left UE to indicate no nerve irritation.    Time  6    Period  Weeks    Status  On-going      PT LONG TERM GOAL #4   Title  Patient will demonstrate 80 lbs of left grip strength or greater to improve ability to perform functional tasks.    Time  6    Period  Weeks    Status  Achieved      PT LONG TERM GOAL #5   Title  Patient will ability to perform work activities with less than or equal to 4/10 cervical and shoulder pain.     Time  6    Period  Weeks    Status  On-going            Plan - 04/29/18 0810    Clinical Impression Statement  Patient tolerated today's treatment well as he presented pain and symptom free since injection yesterday. Patient reported mininmal discomfort following UBE. Correct posture emphasized throughout treatment and no pain today with chin tucks. L hand grip strength has greatly improved since cervical injection yesterday. Mechanical traction initiated today at 15# max without complaint following end of treatment.     Rehab Potential  Good    PT Frequency  2x / week    PT Duration  6 weeks    PT Treatment/Interventions  Neuromuscular re-education;Therapeutic exercise;Therapeutic activities;Ultrasound;Traction;Moist Heat;Electrical Stimulation;Cryotherapy;ADLs/Self Care Home Management;Iontophoresis 4mg /ml Dexamethasone;Patient/family education;Manual techniques;Passive range of motion;Dry needling    PT Next Visit Plan  postural exercises, cervical PROM, manual traction to tolerance, E-stim for pain relief    PT Home Exercise Plan  see patient education section    Consulted and Agree with Plan of Care  Patient       Patient will benefit from skilled therapeutic intervention in order to improve  the following deficits and impairments:  Pain, Impaired UE functional use, Decreased strength, Decreased range of motion, Decreased endurance, Postural dysfunction  Visit Diagnosis: Radiculopathy, cervical  region  Cervicalgia  Abnormal posture     Problem List Patient Active Problem List   Diagnosis Date Noted  . Cervical disc disease with myelopathy 04/23/2018  . Primary insomnia 01/23/2018  . Herpes 02/25/2017  . Essential hypertension 02/25/2017    Marvell Fuller, PTA 04/29/2018, 8:37 AM  [facility] [address] [place], [place], [address] Phone: [phone]   Fax:  [phone]  Name: Eugene Love MRN: [id number] Date of Birth: 1986/05/21

## 2018-05-01 ENCOUNTER — Encounter: Payer: Self-pay | Admitting: Physical Therapy

## 2018-05-01 ENCOUNTER — Ambulatory Visit: Payer: BC Managed Care – PPO | Admitting: Physical Therapy

## 2018-05-01 DIAGNOSIS — M5412 Radiculopathy, cervical region: Secondary | ICD-10-CM | POA: Diagnosis not present

## 2018-05-01 DIAGNOSIS — R293 Abnormal posture: Secondary | ICD-10-CM

## 2018-05-01 DIAGNOSIS — M542 Cervicalgia: Secondary | ICD-10-CM

## 2018-05-01 NOTE — Therapy (Signed)
[facility] [address] [place], [place], [address] Phone: [phone]   Fax:  [phone]  Physical Therapy Treatment  Patient Details  Name: Eugene Love MRN: [id number] Date of Birth: August 22, 1986 Referring Provider: Venita Lick, MD   Encounter Date: 05/01/2018  PT End of Session - 05/01/18 0732    Visit Number  4    Number of Visits  12    Date for PT Re-Evaluation  06/10/18    Authorization Type  FOTO every 5th visit, progress note every 10th visit    PT Start Time  0732    PT Stop Time  0816    PT Time Calculation (min)  44 min    Activity Tolerance  Patient tolerated treatment well    Behavior During Therapy  [facility] for tasks assessed/performed       Past Medical History:  Diagnosis Date  . Cellulitis    inner thigh   . History of right flank pain   . Hypertension     Past Surgical History:  Procedure Laterality Date  . CYSTOSCOPY WITH RETROGRADE PYELOGRAM, URETEROSCOPY AND STENT PLACEMENT Right 01/06/2017   Procedure: CYSTOSCOPY WITH RIGHT RETROGRADE PYELOGRAM, URETEROSCOPY WITH STONE REMOVAL;  Surgeon: Heloise Purpura, MD;  [facility]: [facility];  Service: Urology;  Laterality: Right;  . HOLMIUM LASER APPLICATION Right 01/06/2017   Procedure: HOLMIUM LASER APPLICATION;  Surgeon: Heloise Purpura, MD;  Location: [facility];  Service: Urology;  Laterality: Right;  . KNEE ARTHROTOMY    . URETHRAL DILATION      There were no vitals filed for this visit.  Subjective Assessment - 05/01/18 0811    Subjective  Patient reports feeling minimal discomfort upon waking up but discomfort does not go greater than 3/10.    Pertinent History  HTN    Limitations  Sitting;Reading;Lifting;Standing;House hold activities    Diagnostic tests  MRI, X-Ray    Patient Stated Goals  reduce pain and get back to normal    Currently in Pain?  Yes    Pain Score  2     Pain Location  Neck    Pain Orientation  Left;Posterior    Pain Descriptors / Indicators   Discomfort    Pain Type  Chronic pain    Pain Onset  More than a month ago    Pain Frequency  Intermittent         [facility] PT Assessment - 05/01/18 0001      Assessment   Medical Diagnosis  Degeneration of cervical intervertebral disc     Next MD Visit  TBD    Prior Therapy  no      Precautions   Precautions  None                   [facility] Adult PT Treatment/Exercise - 05/01/18 0001      Exercises   Exercises  Neck      Neck Exercises: Machines for Strengthening   UBE (Upper Arm Bike)  120 RPM x6 min      Neck Exercises: Theraband   Scapula Retraction  20 reps;Limitations    Scapula Retraction Limitations  Pink XTS    Shoulder Extension  20 reps;Limitations    Shoulder Extension Limitations  Pink XTS    Horizontal ABduction  20 reps;Green    Horizontal ABduction Limitations  with gray ball and chin tuck      Neck Exercises: Standing   Lift / Chop  10 reps;20 reps   2x15 each  Theraband Level (Lift/Chop)  Level 2 (Red)    Other Standing Exercises  X to V with chin tuck x20      Neck Exercises: Prone   Neck Retraction  20 reps;3 secs    Shoulder Extension  20 reps   with chin tuck     Modalities   Modalities  Traction      Traction   Type of Traction  Cervical    Min (lbs)  5    Max (lbs)  15    Hold Time  99    Rest Time  5    Time  15               PT Short Term Goals - 04/22/18 1035      PT SHORT TERM GOAL #1   Title  STG=LTG        PT Long Term Goals - 04/29/18 0837      PT LONG TERM GOAL #1   Title  Patient will be independent with HEP    Time  6    Period  Weeks    Status  On-going      PT LONG TERM GOAL #2   Title  Patient will demonstrate 60+ degrees of cervical rotation to safely look over shoulder while driving.    Time  6    Period  Weeks    Status  On-going      PT LONG TERM GOAL #3   Title  Patient will report no neurological symptoms down left UE to indicate no nerve irritation.    Time  6    Period   Weeks    Status  On-going      PT LONG TERM GOAL #4   Title  Patient will demonstrate 80 lbs of left grip strength or greater to improve ability to perform functional tasks.    Time  6    Period  Weeks    Status  Achieved      PT LONG TERM GOAL #5   Title  Patient will ability to perform work activities with less than or equal to 4/10 cervical and shoulder pain.     Time  6    Period  Weeks    Status  On-going            Plan - 05/01/18 0808    Clinical Impression Statement  Patient was able to tolerate treatment well with no reports of pain or discomfort throughout session. Patient noted with more sciatic pain moreso than cervical and L arm pain. Patient required verbal cuing for chin tucks proper form. Patient was able to tolerate 15# traction with no adverse effects. Increase weight next visit.    Clinical Presentation  Evolving    Clinical Decision Making  Low    Rehab Potential  Good    PT Frequency  2x / week    PT Duration  6 weeks    PT Treatment/Interventions  Neuromuscular re-education;Therapeutic exercise;Therapeutic activities;Ultrasound;Traction;Moist Heat;Electrical Stimulation;Cryotherapy;ADLs/Self Care Home Management;Iontophoresis 4mg /ml Dexamethasone;Patient/family education;Manual techniques;Passive range of motion;Dry needling    PT Next Visit Plan  Assess traction increase weight if tolerated. postural exercises, cervical PROM, manual traction to tolerance, E-stim for pain relief    Consulted and Agree with Plan of Care  Patient       Patient will benefit from skilled therapeutic intervention in order to improve the following deficits and impairments:  Pain, Impaired UE functional use, Decreased strength, Decreased range of motion, Decreased  endurance, Postural dysfunction  Visit Diagnosis: Radiculopathy, cervical region  Cervicalgia  Abnormal posture     Problem List Patient Active Problem List   Diagnosis Date Noted  . Cervical disc disease  with myelopathy 04/23/2018  . Primary insomnia 01/23/2018  . Herpes 02/25/2017  . Essential hypertension 02/25/2017   Guss Bunde, PT, DPT 05/01/2018, 8:21 AM   [facility] [address] [place], [place], [address] Phone: [phone]   Fax:  [phone]  Name: Eugene Love MRN: [id number] Date of Birth: 03/06/1986

## 2018-05-04 ENCOUNTER — Ambulatory Visit: Payer: BC Managed Care – PPO | Admitting: Physical Therapy

## 2018-05-04 ENCOUNTER — Encounter: Payer: Self-pay | Admitting: Physical Therapy

## 2018-05-04 DIAGNOSIS — M542 Cervicalgia: Secondary | ICD-10-CM

## 2018-05-04 DIAGNOSIS — M5412 Radiculopathy, cervical region: Secondary | ICD-10-CM | POA: Diagnosis not present

## 2018-05-04 DIAGNOSIS — R293 Abnormal posture: Secondary | ICD-10-CM

## 2018-05-04 NOTE — Patient Instructions (Signed)
Strengthening: Lateral Bend - Isometric (in Neutral)    Using light pressure from fingertips, press into right then left temple. Resist bending head sideways. Hold __10__ seconds. Repeat _5___ times per set. Do _2___ sets per session. Do _2___ sessions per day.   Strengthening: Flexion - Isometric (in Neutral)    Using light pressure from fingertips at forehead, resist bending head forward. Hold __10__ seconds. Repeat __5__ times per set. Do __2__ sets per session. Do _2___ sessions per day.   Strengthening: Extension - Isometric (in Neutral)    Using light pressure from fingertips at back of head, resist bending head backward. Hold _10___ seconds. Repeat __5__ times per set. Do _2___ sets per session. Do __2__ sessions per day.      

## 2018-05-04 NOTE — Therapy (Signed)
[facility] [address] [place], [place], [address] Phone: [phone]   Fax:  [phone]  Physical Therapy Treatment  Patient Details  Name: Eugene Love MRN: [id number] Date of Birth: March 02, 1986 Referring Provider: Venita Lick, MD   Encounter Date: 05/04/2018  PT End of Session - 05/04/18 0757    Visit Number  5    Number of Visits  12    Date for PT Re-Evaluation  06/10/18    Authorization Type  FOTO every 5th visit, progress note every 10th visit    PT Start Time  0729    PT Stop Time  0818    PT Time Calculation (min)  49 min    Activity Tolerance  Patient tolerated treatment well    Behavior During Therapy  [facility] for tasks assessed/performed       Past Medical History:  Diagnosis Date  . Cellulitis    inner thigh   . History of right flank pain   . Hypertension     Past Surgical History:  Procedure Laterality Date  . CYSTOSCOPY WITH RETROGRADE PYELOGRAM, URETEROSCOPY AND STENT PLACEMENT Right 01/06/2017   Procedure: CYSTOSCOPY WITH RIGHT RETROGRADE PYELOGRAM, URETEROSCOPY WITH STONE REMOVAL;  Surgeon: Heloise Purpura, MD;  Location: [facility];  Service: Urology;  Laterality: Right;  . HOLMIUM LASER APPLICATION Right 01/06/2017   Procedure: HOLMIUM LASER APPLICATION;  Surgeon: Heloise Purpura, MD;  Location: [facility];  Service: Urology;  Laterality: Right;  . KNEE ARTHROTOMY    . URETHRAL DILATION      There were no vitals filed for this visit.  Subjective Assessment - 05/04/18 0730    Subjective  Patient reported some discomfort friday evening for unknown reason    Pertinent History  HTN    Limitations  Sitting;Reading;Lifting;Standing;House hold activities    Diagnostic tests  MRI, X-Ray    Patient Stated Goals  reduce pain and get back to normal    Currently in Pain?  Yes    Pain Score  2     Pain Location  Neck    Pain Orientation  Left    Pain Descriptors / Indicators  Discomfort    Pain Type  Chronic pain    Pain  Onset  More than a month ago    Pain Frequency  Intermittent    Aggravating Factors   certain movements    Pain Relieving Factors  nothing at this time         [facility] PT Assessment - 05/04/18 0001      AROM   AROM Assessment Site  Cervical    Cervical - Right Rotation  45    Cervical - Left Rotation  50                   [facility] Adult PT Treatment/Exercise - 05/04/18 0001      Exercises   Exercises  Neck      Neck Exercises: Machines for Strengthening   UBE (Upper Arm Bike)  120 RPM x6 min      Neck Exercises: Theraband   Scapula Retraction  20 reps;Limitations    Scapula Retraction Limitations  Pink XTS    Shoulder Extension  20 reps;Limitations    Shoulder Extension Limitations  Pink XTS    Horizontal ABduction  20 reps;Green    Horizontal ABduction Limitations  with gray ball and chin tuck      Neck Exercises: Standing   Wall Push Ups  20 reps    Wall  Push Ups Limitations  at incline high mat table    Lift / Chop  10 reps;20 reps   2x15   Theraband Level (Lift/Chop)  Level 3 (Green)      Neck Exercises: Seated   Cervical Isometrics  Flexion;Extension;Right lateral flexion;Left lateral flexion;5 secs;5 reps      Neck Exercises: Prone   Neck Retraction  20 reps;3 secs    Shoulder Extension  20 reps   with chin tuck     Traction   Type of Traction  Cervical    Min (lbs)  5    Max (lbs)  15    Hold Time  99    Rest Time  5    Time  15             PT Education - 05/04/18 0801    Education Details  cervical isometrics    Person(s) Educated  Patient    Methods  Explanation;Demonstration;Handout    Comprehension  Verbalized understanding;Returned demonstration       PT Short Term Goals - 04/22/18 1035      PT SHORT TERM GOAL #1   Title  STG=LTG        PT Long Term Goals - 05/04/18 0758      PT LONG TERM GOAL #1   Title  Patient will be independent with HEP    Period  Weeks    Status  On-going      PT LONG TERM GOAL #2   Title   Patient will demonstrate 60+ degrees of cervical rotation to safely look over shoulder while driving.    Time  6    Period  Weeks    Status  On-going   AROM 45 degrees Rt/50 degrees LT     PT LONG TERM GOAL #3   Title  Patient will report no neurological symptoms down left UE to indicate no nerve irritation.    Time  6    Period  Weeks    Status  On-going      PT LONG TERM GOAL #4   Title  Patient will demonstrate 80 lbs of left grip strength or greater to improve ability to perform functional tasks.    Time  6    Period  Weeks    Status  Achieved      PT LONG TERM GOAL #5   Title  Patient will ability to perform work activities with less than or equal to 4/10 cervical and shoulder pain.     Time  6    Period  Weeks    Status  On-going            Plan - 05/04/18 0758    Clinical Impression Statement  Patient tolerated treatment well today. Patient able to progress cervical stabilization exercises today with minimal difficulty with push ups at incline. Patient issued HEP for cervial isometrics today. Patient continued with 15# of cervical traction due to some discomfort after last week, may consider increase pending next treatment assess outcome. Improved cervical rotation bil sides. Goals ongoing at this time.     Rehab Potential  Good    Clinical Impairments Affecting Rehab Potential  5th visit FOTO 40% limitation (52% initial)    PT Frequency  2x / week    PT Duration  6 weeks    PT Treatment/Interventions  Neuromuscular re-education;Therapeutic exercise;Therapeutic activities;Ultrasound;Traction;Moist Heat;Electrical Stimulation;Cryotherapy;ADLs/Self Care Home Management;Iontophoresis 4mg /ml Dexamethasone;Patient/family education;Manual techniques;Passive range of motion;Dry needling    PT Next Visit  Plan  Assess traction increase weight if tolerated. postural exercises, cervical PROM, manual traction to tolerance, E-stim for pain relief    Consulted and Agree with Plan of  Care  Patient       Patient will benefit from skilled therapeutic intervention in order to improve the following deficits and impairments:  Pain, Impaired UE functional use, Decreased strength, Decreased range of motion, Decreased endurance, Postural dysfunction  Visit Diagnosis: Radiculopathy, cervical region  Cervicalgia  Abnormal posture     Problem List Patient Active Problem List   Diagnosis Date Noted  . Cervical disc disease with myelopathy 04/23/2018  . Primary insomnia 01/23/2018  . Herpes 02/25/2017  . Essential hypertension 02/25/2017    Hermelinda Dellen, PTA 05/04/2018, 8:26 AM  [facility] [address] [place], [place], [address] Phone: [phone]   Fax:  [phone]  Name: Ananth Fiallos MRN: [id number] Date of Birth: [birth year]

## 2018-05-07 ENCOUNTER — Encounter: Payer: Self-pay | Admitting: Physical Therapy

## 2018-05-07 ENCOUNTER — Ambulatory Visit: Payer: BC Managed Care – PPO | Admitting: Physical Therapy

## 2018-05-07 DIAGNOSIS — M5412 Radiculopathy, cervical region: Secondary | ICD-10-CM

## 2018-05-07 DIAGNOSIS — M542 Cervicalgia: Secondary | ICD-10-CM

## 2018-05-07 DIAGNOSIS — R293 Abnormal posture: Secondary | ICD-10-CM

## 2018-05-07 NOTE — Therapy (Addendum)
[facility] [place], [place], [address] Phone: [phone]   Fax:  [phone]  Physical Therapy Treatment/Discharge PHYSICAL THERAPY DISCHARGE SUMMARY  Visits from Start of Care: 6  Current functional level related to goals / functional outcomes: See below   Remaining deficits: Goals partially met    Education / Equipment: HEP Plan: Patient agrees to discharge.  Patient goals were partially met. Patient is being discharged due to not returning since the last visit.  ?????    Gabriela Eves, PT, DPT 09/22/18   Patient Details  Name: Eugene Love MRN: [id number] Date of Birth: 1986/05/26 Referring Provider: Melina Schools, MD   Encounter Date: 05/07/2018  PT End of Session - 05/07/18 0839    Visit Number  6    Number of Visits  12    Date for PT Re-Evaluation  06/10/18    Authorization Type  FOTO every 5th visit, progress note every 10th visit    PT Start Time  0816    PT Stop Time  0859    PT Time Calculation (min)  43 min    Activity Tolerance  Patient tolerated treatment well    Behavior During Therapy  [facility] for tasks assessed/performed       Past Medical History:  Diagnosis Date  . Cellulitis    inner thigh   . History of right flank pain   . Hypertension     Past Surgical History:  Procedure Laterality Date  . CYSTOSCOPY WITH RETROGRADE PYELOGRAM, URETEROSCOPY AND STENT PLACEMENT Right 01/06/2017   Procedure: CYSTOSCOPY WITH RIGHT RETROGRADE PYELOGRAM, URETEROSCOPY WITH STONE REMOVAL;  Surgeon: Raynelle Bring, MD;  Location: [facility];  Service: Urology;  Laterality: Right;  . HOLMIUM LASER APPLICATION Right 2/95/2841   Procedure: HOLMIUM LASER APPLICATION;  Surgeon: Raynelle Bring, MD;  Location: [facility];  Service: Urology;  Laterality: Right;  . KNEE ARTHROTOMY    . URETHRAL DILATION      There were no vitals filed for this visit.  Subjective Assessment - 05/07/18 3244    Subjective  Patient  feels some soreness after treatments yet feels better after a day, overall improved    Pertinent History  HTN    Limitations  Sitting;Reading;Lifting;Standing;House hold activities    Diagnostic tests  MRI, X-Ray    Patient Stated Goals  reduce pain and get back to normal    Currently in Pain?  Yes    Pain Score  2     Pain Location  Neck    Pain Orientation  Left    Pain Descriptors / Indicators  Discomfort    Pain Type  Chronic pain    Pain Onset  More than a month ago    Pain Frequency  Intermittent    Aggravating Factors   certain movements    Pain Relieving Factors  at rest         [facility] PT Assessment - 05/07/18 0001      AROM   AROM Assessment Site  Cervical    Cervical - Right Rotation  48    Cervical - Left Rotation  55                   [facility] Adult PT Treatment/Exercise - 05/07/18 0001      Neck Exercises: Machines for Strengthening   UBE (Upper Arm Bike)  120 RPM x6 min      Neck Exercises: Theraband   Scapula Retraction  20 reps;Limitations    Scapula  Retraction Limitations  Pink XTS    Shoulder Extension  20 reps;Limitations    Shoulder Extension Limitations  Pink XTS    Horizontal ABduction  20 reps;Green    Horizontal ABduction Limitations  with gray ball and chin tuck      Neck Exercises: Standing   Wall Push Ups  20 reps    Wall Push Ups Limitations  at incline high mat table    Lift / Chop  15 reps   2x15   Theraband Level (Lift/Chop)  Level 3 (Green)      Neck Exercises: Prone   Neck Retraction  20 reps;3 secs    Shoulder Extension  20 reps   with chin tuck     Traction   Type of Traction  Cervical    Min (lbs)  5    Max (lbs)  17    Hold Time  99    Rest Time  5    Time  15               PT Short Term Goals - 04/22/18 1035      PT SHORT TERM GOAL #1   Title  STG=LTG        PT Long Term Goals - 05/07/18 0844      PT LONG TERM GOAL #1   Title  Patient will be independent with HEP    Time  6    Period   Weeks    Status  Achieved   05/07/18     PT LONG TERM GOAL #2   Title  Patient will demonstrate 60+ degrees of cervical rotation to safely look over shoulder while driving.    Time  6    Period  Weeks    Status  On-going   RT 48 LT 55 05/07/18     PT LONG TERM GOAL #3   Title  Patient will report no neurological symptoms down left UE to indicate no nerve irritation.    Time  6    Period  Weeks    Status  On-going   decreased symptoms from constant to occasional and from int minor pain yet constant numbess in index finger 05/07/18     PT LONG TERM GOAL #4   Title  Patient will demonstrate 80 lbs of left grip strength or greater to improve ability to perform functional tasks.    Time  6    Period  Weeks    Status  Achieved      PT LONG TERM GOAL #5   Title  Patient will ability to perform work activities with less than or equal to 4/10 cervical and shoulder pain.     Time  6    Period  Weeks    Status  Achieved   pain around 2-3/10 at times 05/07/18           Plan - 05/07/18 0841    Clinical Impression Statement  Patient tolerated treatment well today. Patient able to perform all exercises with no difficulty or discomfort, only minimal soreness with push up at incline. Patient tolerated mecanical traction well today. Increased traction to 17# today with good response. Patient improved with bil cervical roation yet not full range at this time. Patient has reported decreased pain with symptoms in left LE from constant to occational minimal pain and numbeness in index finger. Patient met LTG #1 and #5 today with other goals progressing.     Rehab Potential  Good  Clinical Impairments Affecting Rehab Potential  5th visit FOTO 40% limitation (52% initial)    PT Frequency  2x / week    PT Duration  6 weeks    PT Treatment/Interventions  Neuromuscular re-education;Therapeutic exercise;Therapeutic activities;Ultrasound;Traction;Moist Heat;Electrical Stimulation;Cryotherapy;ADLs/Self  Care Home Management;Iontophoresis 49m/ml Dexamethasone;Patient/family education;Manual techniques;Passive range of motion;Dry needling    PT Next Visit Plan  Assess traction and cont postural exercises, cervical PROM, manual traction to tolerance, E-stim for pain relief    Consulted and Agree with Plan of Care  Patient       Patient will benefit from skilled therapeutic intervention in order to improve the following deficits and impairments:  Pain, Impaired UE functional use, Decreased strength, Decreased range of motion, Decreased endurance, Postural dysfunction  Visit Diagnosis: Radiculopathy, cervical region  Cervicalgia  Abnormal posture     Problem List Patient Active Problem List   Diagnosis Date Noted  . Cervical disc disease with myelopathy 04/23/2018  . Primary insomnia 01/23/2018  . Herpes 02/25/2017  . Essential hypertension 02/25/2017    CLadean Raya PTA 05/07/18 9:07 AM   [facility] [address] [place] [address]Phone: [phone]  Fax:  [phone] Name: Eugene GiraldoMRN: [id number] of Birth: [birth year]

## 2018-05-15 ENCOUNTER — Other Ambulatory Visit: Payer: Self-pay | Admitting: *Deleted

## 2018-05-15 MED ORDER — CLONAZEPAM 0.5 MG PO TABS: ORAL_TABLET | ORAL | 5 refills | Status: DC

## 2018-05-25 ENCOUNTER — Telehealth: Payer: Self-pay | Admitting: Physician Assistant

## 2018-05-26 ENCOUNTER — Other Ambulatory Visit: Payer: Self-pay | Admitting: Physician Assistant

## 2018-05-26 ENCOUNTER — Telehealth: Payer: Self-pay | Admitting: Physician Assistant

## 2018-05-26 MED ORDER — CARVEDILOL PHOSPHATE ER 10 MG PO CP24: 10.0000 mg | ORAL_CAPSULE | Freq: Every day | ORAL | 1 refills | Status: DC

## 2018-05-26 NOTE — Telephone Encounter (Signed)
In reviewing his medications, amlodipine can cause swelling in the feet and  Ankles.  We can switch meds if he is bad enough.

## 2018-05-26 NOTE — Telephone Encounter (Signed)
See call from yesterday -

## 2018-05-26 NOTE — Telephone Encounter (Signed)
I sent carvedilol extended release 10 mg to [facility].  He can take 1/day.  We will plan to recheck him in about 4 weeks.

## 2018-05-26 NOTE — Telephone Encounter (Signed)
Patient aware and rx sent to [facility]

## 2018-05-26 NOTE — Telephone Encounter (Signed)
Patient states that he is having pitting edema. He states that it is causing his legs to be painful.

## 2018-06-15 ENCOUNTER — Ambulatory Visit: Payer: BC Managed Care – PPO | Admitting: Family Medicine

## 2018-06-15 ENCOUNTER — Encounter: Payer: Self-pay | Admitting: Family Medicine

## 2018-06-15 VITALS — BP 134/90 | HR 88 | Temp 98.1°F | Ht 71.0 in | Wt 272.2 lb

## 2018-06-15 DIAGNOSIS — N200 Calculus of kidney: Secondary | ICD-10-CM | POA: Diagnosis not present

## 2018-06-15 LAB — MICROSCOPIC EXAMINATION
Bacteria, UA: NONE SEEN
EPITHELIAL CELLS (NON RENAL): NONE SEEN /HPF (ref 0–10)
RBC MICROSCOPIC, UA: NONE SEEN /HPF (ref 0–2)
Renal Epithel, UA: NONE SEEN /hpf
WBC, UA: NONE SEEN /hpf (ref 0–5)

## 2018-06-15 LAB — URINALYSIS, COMPLETE
Bilirubin, UA: NEGATIVE
GLUCOSE, UA: NEGATIVE
Ketones, UA: NEGATIVE
Leukocytes, UA: NEGATIVE
NITRITE UA: NEGATIVE
Protein, UA: NEGATIVE
RBC, UA: NEGATIVE
Specific Gravity, UA: 1.015 (ref 1.005–1.030)
Urobilinogen, Ur: 0.2 mg/dL (ref 0.2–1.0)
pH, UA: 6 (ref 5.0–7.5)

## 2018-06-15 MED ORDER — OXYCODONE-ACETAMINOPHEN 10-325 MG PO TABS: 1.0000 | ORAL_TABLET | Freq: Three times a day (TID) | ORAL | 0 refills | Status: DC | PRN

## 2018-06-15 MED ORDER — TAMSULOSIN HCL 0.4 MG PO CAPS: 0.4000 mg | ORAL_CAPSULE | Freq: Every day | ORAL | 3 refills | Status: DC

## 2018-06-15 MED ORDER — ONDANSETRON 4 MG PO TBDP: 4.0000 mg | ORAL_TABLET | Freq: Three times a day (TID) | ORAL | 0 refills | Status: DC | PRN

## 2018-06-15 NOTE — Progress Notes (Signed)
BP 134/90   Pulse 88   Temp 98.1 F (36.7 C) (Oral)   Ht 5\' 11"  (1.803 m)   Wt 123.5 kg   BMI 37.96 kg/m    Subjective:    Patient ID: Eugene Love, male    DOB: 1986-02-23, 32 y.o.   MRN: [id number]  HPI: Eugene Love is a 32 y.o. male with a history of kidney stones presenting on 06/15/2018 for possible kidney stone. Pain began 4 days ago and is described as sharp non-radiating pain on the left lower flank. Associated symptoms include nausea, vomiting, and chills. At that time the patient took 2 oxycodone pills for the pain. Patient was on his way to the ER when the pain subsided. Patient had another bout of similar pain on Sunday afternoon that lasted 2-3 hours which he did not take anything for. Today patient is pain free. He denies any recent illness, fever, chest pain, sob, abdominal pain, hematuria, or passing of the stone. His last kidney stone was over a year ago and required a stent.    Relevant past medical, surgical, family and social history reviewed and updated as indicated. Interim medical history since our last visit reviewed. Allergies and medications reviewed and updated.  Review of Systems  Constitutional: Positive for chills and diaphoresis. Negative for appetite change and fever.  HENT: Negative.   Eyes: Negative.   Respiratory: Negative for cough and shortness of breath.   Cardiovascular: Negative for chest pain and leg swelling.  Gastrointestinal: Positive for diarrhea and nausea. Negative for abdominal pain.  Genitourinary: Positive for flank pain (left). Negative for hematuria.  Neurological: Negative.  Negative for weakness.    Per HPI unless specifically indicated above   Allergies as of 06/15/2018   No Known Allergies     Medication List        Accurate as of 06/15/18  9:37 AM. Always use your most recent med list.          carvedilol 10 MG 24 hr capsule Commonly known as:  COREG CR Take 1 capsule (10 mg total) by mouth daily.     clonazePAM 0.5 MG tablet Commonly known as:  KLONOPIN TAKE ONE (1) TABLET AT BEDTIME          Objective:    BP 134/90   Pulse 88   Temp 98.1 F (36.7 C) (Oral)   Ht 5\' 11"  (1.803 m)   Wt 123.5 kg   BMI 37.96 kg/m   Wt Readings from Last 3 Encounters:  06/15/18 123.5 kg  04/23/18 123.2 kg  03/30/18 123.8 kg    Physical Exam  Constitutional: He is oriented to person, place, and time. He appears well-developed and well-nourished. No distress.  HENT:  Head: Normocephalic and atraumatic.  Eyes: Conjunctivae are normal.  Neck: Normal range of motion. Neck supple. No thyromegaly present.  Cardiovascular: Normal rate, regular rhythm, normal heart sounds and intact distal pulses. Exam reveals no gallop and no friction rub.  No murmur heard. Pulmonary/Chest: Effort normal and breath sounds normal.  Abdominal: Soft. Bowel sounds are normal. There is no CVA tenderness.  Neurological: He is alert and oriented to person, place, and time. No sensory deficit.  Skin: Skin is warm. He is not diaphoretic.  Psychiatric: He has a normal mood and affect.    Results for orders placed or performed in visit on 06/15/18  Microscopic Examination  Result Value Ref Range   WBC, UA None seen 0 - 5 /hpf   RBC, UA  None seen 0 - 2 /hpf   Epithelial Cells (non renal) None seen 0 - 10 /hpf   Renal Epithel, UA None seen None seen /hpf   Bacteria, UA None seen None seen/Few  Urinalysis, Complete  Result Value Ref Range   Specific Gravity, UA 1.015 1.005 - 1.030   pH, UA 6.0 5.0 - 7.5   Color, UA Yellow Yellow   Appearance Ur Clear Clear   Leukocytes, UA Negative Negative   Protein, UA Negative Negative/Trace   Glucose, UA Negative Negative   Ketones, UA Negative Negative   RBC, UA Negative Negative   Bilirubin, UA Negative Negative   Urobilinogen, Ur 0.2 0.2 - 1.0 mg/dL   Nitrite, UA Negative Negative   Microscopic Examination See below:       Assessment & Plan:   Problem List Items  Addressed This Visit    None    Visit Diagnoses    Renal stone    -  Primary   Relevant Medications   ondansetron (ZOFRAN ODT) 4 MG disintegrating tablet   oxyCODONE-acetaminophen (PERCOCET) 10-325 MG tablet   tamsulosin (FLOMAX) 0.4 MG CAPS capsule   Other Relevant Orders   Urinalysis, Complete (Completed)    Renal Stone Patient presents with possible kidney stone on the left side Patient has had 2 episodes of pain since Thursday with associated nausea, vomiting, and chillsf. Patient took oxycodone which helped. Pain is similar to kidney stones in the past. His last stone was in 2018 and required stenting. UA today was normal. Recommend patient take percocet, zofran, and flomax for supportive measures. If pain worsens or persistent past 2 weeks I recommend he see the urologist again for further evaluation. I will order imaging at that time.   Follow up plan: Return if symptoms worsen or fail to improve.  Counseling provided for all of the vaccine components Orders Placed This Encounter  Procedures  . Urinalysis, Complete   Patient seen and examined with Cadence Furth PA student, agree with assessment and plan above.  Difficult to assess because patient does not have pain today but will give the benefit of the doubt based on patient's history that it could be a renal stone and will treat as such.  Recommend following up with urology or ED if it worsens. Arville Care, MD [facility] 06/15/2018, 9:37 AM

## 2018-07-01 ENCOUNTER — Telehealth: Payer: Self-pay | Admitting: Physician Assistant

## 2018-07-01 ENCOUNTER — Encounter (HOSPITAL_COMMUNITY): Payer: Self-pay | Admitting: *Deleted

## 2018-07-01 ENCOUNTER — Emergency Department (HOSPITAL_COMMUNITY): Payer: BC Managed Care – PPO

## 2018-07-01 ENCOUNTER — Emergency Department (HOSPITAL_COMMUNITY)
Admission: EM | Admit: 2018-07-01 | Discharge: 2018-07-01 | Disposition: A | Discharge status: Home / Self Care | Payer: BC Managed Care – PPO | Attending: Emergency Medicine | Admitting: Emergency Medicine

## 2018-07-01 DIAGNOSIS — N200 Calculus of kidney: Secondary | ICD-10-CM | POA: Insufficient documentation

## 2018-07-01 DIAGNOSIS — Z79899 Other long term (current) drug therapy: Secondary | ICD-10-CM | POA: Insufficient documentation

## 2018-07-01 DIAGNOSIS — R109 Unspecified abdominal pain: Secondary | ICD-10-CM | POA: Diagnosis present

## 2018-07-01 DIAGNOSIS — I10 Essential (primary) hypertension: Secondary | ICD-10-CM | POA: Diagnosis not present

## 2018-07-01 LAB — CBC WITH DIFFERENTIAL/PLATELET
ABS IMMATURE GRANULOCYTES: 0.06 10*3/uL (ref 0.00–0.07)
BASOS ABS: 0.1 10*3/uL (ref 0.0–0.1)
BASOS PCT: 0 %
Eosinophils Absolute: 0.1 10*3/uL (ref 0.0–0.5)
Eosinophils Relative: 1 %
HCT: 45.9 % (ref 39.0–52.0)
Hemoglobin: 14.8 g/dL (ref 13.0–17.0)
IMMATURE GRANULOCYTES: 0 %
Lymphocytes Relative: 9 %
Lymphs Abs: 1.1 10*3/uL (ref 0.7–4.0)
MCH: 29.4 pg (ref 26.0–34.0)
MCHC: 32.2 g/dL (ref 30.0–36.0)
MCV: 91.3 fL (ref 80.0–100.0)
Monocytes Absolute: 0.7 10*3/uL (ref 0.1–1.0)
Monocytes Relative: 5 %
NEUTROS ABS: 11.4 10*3/uL — AB (ref 1.7–7.7)
NEUTROS PCT: 85 %
PLATELETS: 287 10*3/uL (ref 150–400)
RBC: 5.03 MIL/uL (ref 4.22–5.81)
RDW: 11.7 % (ref 11.5–15.5)
WBC: 13.5 10*3/uL — AB (ref 4.0–10.5)
nRBC: 0 % (ref 0.0–0.2)

## 2018-07-01 LAB — COMPREHENSIVE METABOLIC PANEL
ALT: 48 U/L — AB (ref 0–44)
AST: 26 U/L (ref 15–41)
Albumin: 4.7 g/dL (ref 3.5–5.0)
Alkaline Phosphatase: 64 U/L (ref 38–126)
Anion gap: 9 (ref 5–15)
BUN: 14 mg/dL (ref 6–20)
CALCIUM: 9.5 mg/dL (ref 8.9–10.3)
CHLORIDE: 105 mmol/L (ref 98–111)
CO2: 26 mmol/L (ref 22–32)
CREATININE: 1.4 mg/dL — AB (ref 0.61–1.24)
Glucose, Bld: 106 mg/dL — ABNORMAL HIGH (ref 70–99)
Potassium: 4.1 mmol/L (ref 3.5–5.1)
Sodium: 140 mmol/L (ref 135–145)
Total Bilirubin: 0.6 mg/dL (ref 0.3–1.2)
Total Protein: 8.1 g/dL (ref 6.5–8.1)

## 2018-07-01 LAB — URINALYSIS, ROUTINE W REFLEX MICROSCOPIC
Bacteria, UA: NONE SEEN
Bilirubin Urine: NEGATIVE
GLUCOSE, UA: NEGATIVE mg/dL
Ketones, ur: NEGATIVE mg/dL
LEUKOCYTES UA: NEGATIVE
NITRITE: NEGATIVE
PROTEIN: NEGATIVE mg/dL
Specific Gravity, Urine: 1.013 (ref 1.005–1.030)
pH: 6 (ref 5.0–8.0)

## 2018-07-01 LAB — LIPASE, BLOOD: LIPASE: 40 U/L (ref 11–51)

## 2018-07-01 MED ORDER — NAPROXEN 375 MG PO TABS: 375.0000 mg | ORAL_TABLET | Freq: Two times a day (BID) | ORAL | 0 refills | Status: DC

## 2018-07-01 MED ORDER — OXYCODONE-ACETAMINOPHEN 5-325 MG PO TABS: 1.0000 | ORAL_TABLET | Freq: Four times a day (QID) | ORAL | 0 refills | Status: DC | PRN

## 2018-07-01 MED ORDER — ONDANSETRON 4 MG PO TBDP
4.0000 mg | ORAL_TABLET | Freq: Once | ORAL | Status: AC | PRN
  Administered 2018-07-01: 4 mg via ORAL
  Filled 2018-07-01: qty 1

## 2018-07-01 MED ORDER — KETOROLAC TROMETHAMINE 30 MG/ML IJ SOLN
30.0000 mg | Freq: Once | INTRAMUSCULAR | Status: AC
  Administered 2018-07-01: 30 mg via INTRAVENOUS
  Filled 2018-07-01: qty 1

## 2018-07-01 MED ORDER — ONDANSETRON HCL 4 MG/2ML IJ SOLN
4.0000 mg | Freq: Once | INTRAMUSCULAR | Status: AC
  Administered 2018-07-01: 4 mg via INTRAVENOUS
  Filled 2018-07-01: qty 2

## 2018-07-01 MED ORDER — TAMSULOSIN HCL 0.4 MG PO CAPS: 0.4000 mg | ORAL_CAPSULE | Freq: Every day | ORAL | 0 refills | Status: DC

## 2018-07-01 MED ORDER — OXYCODONE-ACETAMINOPHEN 5-325 MG PO TABS
1.0000 | ORAL_TABLET | ORAL | Status: DC | PRN
  Administered 2018-07-01: 1 via ORAL
  Filled 2018-07-01: qty 1

## 2018-07-01 MED ORDER — OXYCODONE-ACETAMINOPHEN 5-325 MG PO TABS
2.0000 | ORAL_TABLET | Freq: Once | ORAL | Status: AC
  Administered 2018-07-01: 2 via ORAL
  Filled 2018-07-01: qty 2

## 2018-07-01 MED ORDER — ONDANSETRON 4 MG PO TBDP: 4.0000 mg | ORAL_TABLET | ORAL | 0 refills | Status: DC | PRN

## 2018-07-01 MED ORDER — MORPHINE SULFATE (PF) 4 MG/ML IV SOLN
4.0000 mg | Freq: Once | INTRAVENOUS | Status: AC
  Administered 2018-07-01: 4 mg via INTRAVENOUS
  Filled 2018-07-01: qty 1

## 2018-07-01 MED ORDER — SODIUM CHLORIDE 0.9 % IV SOLN
Freq: Once | INTRAVENOUS | Status: AC
  Administered 2018-07-01: 20:00:00 via INTRAVENOUS

## 2018-07-01 NOTE — ED Triage Notes (Addendum)
Pt complains of left flank pain and nausea since this afternoon. Pt feels like he has kidney stone. Pt has hx of kidney stones. Pt states he has had symptoms for 2 weeks, went to PCP and tried Flomax. Pt's PCP advised to come to ED.

## 2018-07-01 NOTE — ED Provider Notes (Signed)
[facility]-EMERGENCY DEPT Provider Note   CSN: [id number] Arrival date & time: 07/01/18  1655     History   Chief Complaint Chief Complaint  Patient presents with  . Flank Pain    HPI Eugene Love is a 32 y.o. male.  HPI Patient has known history of kidney stones.  He reports 2 weeks ago he started getting left flank pain that he thought was consistent with a kidney stone.  He saw his primary care doctor and things were improving by that time.  At that time, he was started on Flomax and taking some pain medication as needed.  It seemed to be improving.  He thought at one point that he might of passed stone when he got a bit of a sharp pain with urinating.  He reports today however all of a sudden he felt like he could not urinate and he got a very intense pain going from his bladder to his kidney.  He reports since that time the pain has been fairly severe although it has eased off slightly in the more recent few hours.  He got nauseated and vomited several times as well.  He has not had any fever. Past Medical History:  Diagnosis Date  . Cellulitis    inner thigh   . History of right flank pain   . Hypertension     Patient Active Problem List   Diagnosis Date Noted  . Cervical disc disease with myelopathy 04/23/2018  . Primary insomnia 01/23/2018  . Herpes 02/25/2017  . Essential hypertension 02/25/2017    Past Surgical History:  Procedure Laterality Date  . CYSTOSCOPY WITH RETROGRADE PYELOGRAM, URETEROSCOPY AND STENT PLACEMENT Right 01/06/2017   Procedure: CYSTOSCOPY WITH RIGHT RETROGRADE PYELOGRAM, URETEROSCOPY WITH STONE REMOVAL;  Surgeon: Heloise Purpura, MD;  Location: [facility];  Service: Urology;  Laterality: Right;  . HOLMIUM LASER APPLICATION Right 01/06/2017   Procedure: HOLMIUM LASER APPLICATION;  Surgeon: Heloise Purpura, MD;  Location: [facility];  Service: Urology;  Laterality: Right;  . KNEE ARTHROTOMY    . URETHRAL DILATION          Home  Medications    Prior to Admission medications   Medication Sig Start Date End Date Taking? Authorizing Provider  carvedilol (COREG CR) 10 MG 24 hr capsule Take 1 capsule (10 mg total) by mouth daily. 05/26/18  Yes Remus Loffler, PA-C  clonazePAM (KLONOPIN) 0.5 MG tablet TAKE ONE (1) TABLET AT BEDTIME Patient taking differently: Take 0.5 mg by mouth at bedtime as needed for anxiety. TAKE ONE (1) TABLET AT BEDTIME 05/15/18  Yes Remus Loffler, PA-C  ondansetron (ZOFRAN ODT) 4 MG disintegrating tablet Take 1 tablet (4 mg total) by mouth every 8 (eight) hours as needed for nausea or vomiting. 06/15/18  Yes Dettinger, Elige Radon, MD  oxyCODONE-acetaminophen (PERCOCET) 10-325 MG tablet Take 1 tablet by mouth every 8 (eight) hours as needed for pain. 06/15/18  Yes Dettinger, Elige Radon, MD  tamsulosin (FLOMAX) 0.4 MG CAPS capsule Take 1 capsule (0.4 mg total) by mouth daily. 06/15/18  Yes Dettinger, Elige Radon, MD  naproxen (NAPROSYN) 375 MG tablet Take 1 tablet (375 mg total) by mouth 2 (two) times daily. 07/01/18   Arby Barrette, MD  ondansetron (ZOFRAN ODT) 4 MG disintegrating tablet Take 1 tablet (4 mg total) by mouth every 4 (four) hours as needed for nausea or vomiting. 07/01/18   Arby Barrette, MD  oxyCODONE-acetaminophen (PERCOCET) 5-325 MG tablet Take 1-2 tablets by mouth every  6 (six) hours as needed. 07/01/18   Arby Barrette, MD  tamsulosin (FLOMAX) 0.4 MG CAPS capsule Take 1 capsule (0.4 mg total) by mouth daily. 07/01/18   Arby Barrette, MD    Family History No family history on file.  Social History Social History   Tobacco Use  . Smoking status: Never Smoker  . Smokeless tobacco: Current User    Types: Snuff  Substance Use Topics  . Alcohol use: Yes    Comment: 6 beers over the last month   . Drug use: No     Allergies   Patient has no known allergies.   Review of Systems Review of Systems 10 Systems reviewed and are negative for acute change except as noted in the  HPI.  Physical Exam Updated Vital Signs BP (!) 144/97   Pulse 84   Temp 98.1 F (36.7 C) (Oral)   Resp 17   Ht 5\' 11"  (1.803 m)   Wt 123.4 kg   SpO2 98%   BMI 37.94 kg/m   Physical Exam  Constitutional: He is oriented to person, place, and time. He appears well-developed and well-nourished. No distress.  HENT:  Head: Normocephalic and atraumatic.  Eyes: EOM are normal.  Cardiovascular: Normal rate, regular rhythm, normal heart sounds and intact distal pulses.  Pulmonary/Chest: Effort normal and breath sounds normal.  Abdominal: Soft. He exhibits no distension. There is no tenderness. There is no guarding.  Musculoskeletal: Normal range of motion. He exhibits no edema or tenderness.  Neurological: He is alert and oriented to person, place, and time. No cranial nerve deficit. He exhibits normal muscle tone. Coordination normal.  Skin: Skin is warm and dry.  Psychiatric: He has a normal mood and affect.     ED Treatments / Results  Labs (all labs ordered are listed, but only abnormal results are displayed) Labs Reviewed  URINALYSIS, ROUTINE W REFLEX MICROSCOPIC - Abnormal; Notable for the following components:      Result Value   Hgb urine dipstick MODERATE (*)    All other components within normal limits  COMPREHENSIVE METABOLIC PANEL - Abnormal; Notable for the following components:   Glucose, Bld 106 (*)    Creatinine, Ser 1.40 (*)    ALT 48 (*)    All other components within normal limits  CBC WITH DIFFERENTIAL/PLATELET - Abnormal; Notable for the following components:   WBC 13.5 (*)    Neutro Abs 11.4 (*)    All other components within normal limits  LIPASE, BLOOD    EKG None  Radiology Ct Renal Stone Study  Result Date: 07/01/2018 CLINICAL DATA:  LEFT flank pain.  History of nephrolithiasis. EXAM: CT ABDOMEN AND PELVIS WITHOUT CONTRAST TECHNIQUE: Multidetector CT imaging of the abdomen and pelvis was performed following the standard protocol without IV  contrast. COMPARISON:  12/26/2016 CT urogram FINDINGS: Lower chest: No acute abnormality. Hepatobiliary: No focal liver abnormality is seen. No gallstones, gallbladder wall thickening, or biliary dilatation. Pancreas: Unremarkable. No pancreatic ductal dilatation or surrounding inflammatory changes. Spleen: Normal in size without focal abnormality. Adrenals/Urinary Tract: No RIGHT renal calculus or hydronephrosis. There is moderate to marked LEFT hydronephrosis and hydroureter. There is a 5 mm stone lodged at the LEFT ureterovesical junction. No bladder abnormality or bladder calculi. Stomach/Bowel: Stomach is within normal limits. Appendix appears normal. No evidence of bowel wall thickening, distention, or inflammatory changes. Vascular/Lymphatic: No significant vascular findings are present. No enlarged abdominal or pelvic lymph nodes. Reproductive: Prostate is unremarkable. Other: No abdominal wall hernia  or abnormality. No abdominopelvic ascites. Musculoskeletal: No acute or significant osseous findings. Lumbar spondylosis, most pronounced at L4-5, with calcified central disc extrusion and vacuum containing synovial cyst or disc material. This has mildly progressed from priors. IMPRESSION: Moderate-to-marked LEFT hydronephrosis and hydroureter secondary to a partially obstructing 5 mm stone lodged at the LEFT UVJ. Electronically Signed   By: Elsie Stain M.D.   On: 07/01/2018 21:27    Procedures Procedures (including critical care time)  Medications Ordered in ED Medications  ondansetron (ZOFRAN-ODT) disintegrating tablet 4 mg (4 mg Oral Given 07/01/18 1718)  0.9 %  sodium chloride infusion ( Intravenous Stopped 07/01/18 2327)  morphine 4 MG/ML injection 4 mg (4 mg Intravenous Given 07/01/18 2030)  ondansetron (ZOFRAN) injection 4 mg (4 mg Intravenous Given 07/01/18 2027)  ketorolac (TORADOL) 30 MG/ML injection 30 mg (30 mg Intravenous Given 07/01/18 2029)  oxyCODONE-acetaminophen (PERCOCET/ROXICET)  5-325 MG per tablet 2 tablet (2 tablets Oral Given 07/01/18 2328)     Initial Impression / Assessment and Plan / ED Course  I have reviewed the triage vital signs and the nursing notes.  Pertinent labs & imaging results that were available during my care of the patient were reviewed by me and considered in my medical decision making (see chart for details).  Clinical Course as of Jul 02 1430  Wed Jul 01, 2018  2154 Consult urology ordered   [MP]  2221 Consult to urology reordered   [MP]    Clinical Course User Index [MP] Arby Barrette, MD    Consult: Reviewed to Dr. Laverle Patter.  Advises to continue management for pain and continue Flomax.  He will see the patient on outpatient basis.  Still expectantly hopeful for the stone to pass.  Patient is alert and appropriate.  He is nontoxic in appearance.  He has been successfully pain control.  No signs of urinary tract infection.  Will transition back to oral pain medications and continue Flomax.  He is counseled on very limited use of NSAIDs for the next couple of days and close follow-up.  Return precautions reviewed.  Final Clinical Impressions(s) / ED Diagnoses   Final diagnoses:  Kidney stone    ED Discharge Orders         Ordered    oxyCODONE-acetaminophen (PERCOCET) 5-325 MG tablet  Every 6 hours PRN     07/01/18 2322    tamsulosin (FLOMAX) 0.4 MG CAPS capsule  Daily     07/01/18 2322    naproxen (NAPROSYN) 375 MG tablet  2 times daily     07/01/18 2322    ondansetron (ZOFRAN ODT) 4 MG disintegrating tablet  Every 4 hours PRN     07/01/18 2322           Arby Barrette, MD 07/02/18 1432

## 2018-07-01 NOTE — Telephone Encounter (Signed)
Please advise on order for scan.

## 2018-07-01 NOTE — Discharge Instructions (Addendum)
1.  Take Flomax daily.  Take plenty of fluids.  You may take naproxen sparingly for a couple of days to help with pain.  Do not continue naproxen for more than 3 to 4 days without a recheck.  You may take Percocet 1 to 2 tablets every 6 hours as needed. 2.  Call Dr. Vevelyn Royals office tomorrow to schedule a recheck. 3.  Return to the emergency department if your pain is not controlled by Percocet and naproxen, you develop fever, vomiting or other concerning symptoms.

## 2018-07-07 ENCOUNTER — Encounter (HOSPITAL_COMMUNITY): Payer: Self-pay | Admitting: General Practice

## 2018-07-07 ENCOUNTER — Other Ambulatory Visit: Payer: Self-pay | Admitting: Urology

## 2018-07-08 NOTE — H&P (Signed)
Office Visit Report     07/07/2018   --------------------------------------------------------------------------------   Eugene Love  MRN: [id number]  PRIMARY CARE:  Kevin FentonSamuel Bradshaw, MD  DOB: 09/09/1985, 32 year old Male  REFERRING:  Kevin FentonSamuel Bradshaw, MD  SSN: -[phone]  PROVIDER:  Heloise PurpuraLester Borden, M.D.    TREATING:  Donnalee CurryMegan Parks, P.A.    LOCATION:  [facility] [phone]   --------------------------------------------------------------------------------   CC: I have ureteral stone.  HPI: Eugene Love is a 32 year-old male established patient who is here for ureteral stone.  This patient went to the ER on 07/01/18 for t/o flank pain, and CT Scan indicates a 5 mm calculus at the left UVJ and moderate left hydronephrosis. He was discharged with Percocet, Flomax, and Zofran. He was overall feeling well at his last office visit on 07/03/18.   Today, he c/o left flank pain radiating down his left leg. He also c/o nausea, but he denies vomiting, chills, or fever. He also c/o difficulty voiding but he denies dysuria or gross hematuria. He is requesting surgical intervention.   The problem is on the left side. This is not his first kidney stone. Pain is occuring on the left side.     ALLERGIES: None   MEDICATIONS: Percocet  Carvedilol  Clonazepam PRN  Flomax 0.4 mg capsule  Zofran     GU PSH: Cysto Uretero Lithotripsy, Right - 2018    NON-GU PSH: Knee Arthroscopy/surgery    GU PMH: Hydronephrosis - 07/03/2018 Ureteral calculus - 07/03/2018, - 2018 History of urolithiasis - 02/04/2017 Microscopic hematuria - 2018 Renal colic - 2018      PMH Notes:   1) Urolithiasis: He has a history of recurrent urolithiasis.   May 2018: R ureteroscopic laser lithotripsy (calcium oxalate dihydrate)   NON-GU PMH: Hypertension    FAMILY HISTORY: 1 son - Runs in Family   SOCIAL HISTORY: Marital Status: Married Preferred Language: English; Ethnicity: Not Hispanic Or Latino;  Race: White Current Smoking Status: Patient has never smoked.   Tobacco Use Assessment Completed: Used Tobacco in last 30 days? Does drink.  Drinks 3 caffeinated drinks per day.    REVIEW OF SYSTEMS:    GU Review Male:   Patient denies frequent urination, hard to postpone urination, burning/ pain with urination, get up at night to urinate, leakage of urine, stream starts and stops, trouble starting your stream, have to strain to urinate , erection problems, and penile pain.  Gastrointestinal (Upper):   Patient denies nausea, vomiting, and indigestion/ heartburn.  Gastrointestinal (Lower):   Patient denies diarrhea and constipation.  Constitutional:   Patient denies fever, night sweats, weight loss, and fatigue.  Skin:   Patient denies skin rash/ lesion and itching.  Eyes:   Patient denies blurred vision and double vision.  Ears/ Nose/ Throat:   Patient denies sore throat and sinus problems.  Hematologic/Lymphatic:   Patient denies swollen glands and easy bruising.  Cardiovascular:   Patient denies leg swelling and chest pains.  Respiratory:   Patient denies cough and shortness of breath.  Endocrine:   Patient denies excessive thirst.  Musculoskeletal:   Patient reports back pain. Patient denies joint pain.  Neurological:   Patient denies headaches and dizziness.  Psychologic:   Patient denies depression and anxiety.   VITAL SIGNS:      07/07/2018 10:35 AM  BP 146/92 mmHg  Pulse 79 /min  Temperature 97.1 F / 36.1 C   MULTI-SYSTEM PHYSICAL EXAMINATION:    Constitutional: Well-nourished. No  physical deformities. Normally developed. Good grooming.  Neck: Neck symmetrical, not swollen. Normal tracheal position.  Respiratory: No labored breathing, no use of accessory muscles.   Neurologic / Psychiatric: Oriented to time, oriented to place, oriented to person. No depression, no anxiety, no agitation.   Gastrointestinal: Obese abdomen. No mass, no tenderness, no rigidity. + Left CVAT.   Musculoskeletal: Normal gait and station of head and neck.     PAST DATA REVIEWED:  Source Of History:  Patient  X-Ray Review: C.T. Abdomen/Pelvis: Reviewed Films. Discussed With Patient.     PROCEDURES:         KUB - [id number]  A single view of the abdomen is obtained. 5 mm calculus at left UVJ persists.              PVR Ultrasound - 16109  Scanned Volume: 23 cc         Urinalysis w/Scope - 81001 Dipstick Dipstick Cont'd Micro  Color: Yellow Bilirubin: Neg mg/dL WBC/hpf: 0 - 5/hpf  Appearance: Cloudy Ketones: Neg mg/dL RBC/hpf: 0 - 2/hpf  Specific Gravity: 1.015 Blood: Neg ery/uL Bacteria: Few (10-25/hpf)  pH: 7.5 Protein: Trace mg/dL Cystals: Amorph Phosphates  Glucose: Neg mg/dL Urobilinogen: 0.2 mg/dL Casts: NS (Not Seen)    Nitrites: Neg Trichomonas: Not Present    Leukocyte Esterase: 2+ leu/uL Mucous: Present      Epithelial Cells: NS (Not Seen)      Yeast: NS (Not Seen)      Sperm: Not Present    ASSESSMENT:      ICD-10 Details  1 GU:   Ureteral calculus - N20.1    PLAN:            Medications New Meds: Percocet 5 mg-325 mg tablet 1 tablet PO Q 6 H PRN   #6  0 Refill(s)  Percocet 5 mg-325 mg tablet 1 tablet PO Q 6 H PRN   #6  0 Refill(s)  Percocet 5 mg-325 mg tablet 1 tablet PO PRN   #14  0 Refill(s)            Orders Labs Urine Culture  X-Rays: KUB          Schedule         Document Letter(s):  Created for Patient: Clinical Summary         Notes:   This patient's 5 mm calculus at the left UVJ persists, and he is very uncomfortable. We discussed option to have ureteroscopy versus ESWL, and he chooses ESWL for the fastest potential resolution. He understands that ESWL may not be effective, and in that case, he would require ureterscopy and possible ureteral stent placement. Initially, we were planning for this Thursday, 07/09/18, but that doctor had no availability. Therefore, we planned for Monday, 07/13/18. He was provided two different scripts for  Percocet for pain relief anticipating that he would not be treated until next Monday. However, the on call doctor this upcoming Thursday, has agreed to treat this patient with ESWL. Patient was notified, and he was also provided a return to work slip including parameters for returning to work. He understands to notify the office if he experiences intolerable pain, fever, chills, nausea, or vomiting.   CC: Dr. Kevin Fenton.        Next Appointment:      Next Appointment: 07/09/2018 04:00 PM    Appointment Type: Surgery     Location: [facility] [phone]    Provider: Jerilee Field, M.D.  Reason for Visit: [facility]/OP LT ESWL      * Signed by Donnalee Curry, P.A. on 07/07/18 at 3:27 PM (EST)*     The information contained in this medical record document is considered private and confidential patient information. This information can only be used for the medical diagnosis and/or medical services that are being provided by the patient's selected caregivers. This information can only be distributed outside of the patient's care if the patient agrees and signs waivers of authorization for this information to be sent to an outside source or route.  I discussed with PA [facility]. I reviewed pt's chart, labs and images.

## 2018-07-09 ENCOUNTER — Ambulatory Visit (HOSPITAL_COMMUNITY): Admission: RE | Admit: 2018-07-09 | Payer: BC Managed Care – PPO | Source: Ambulatory Visit | Admitting: Urology

## 2018-07-09 ENCOUNTER — Other Ambulatory Visit: Payer: Self-pay | Admitting: Physician Assistant

## 2018-07-09 HISTORY — DX: Personal history of urinary calculi: Z87.442

## 2018-07-09 SURGERY — LITHOTRIPSY, ESWL
Anesthesia: LOCAL | Laterality: Left

## 2018-07-15 ENCOUNTER — Encounter: Payer: Self-pay | Admitting: Physician Assistant

## 2018-07-15 ENCOUNTER — Ambulatory Visit: Payer: BC Managed Care – PPO | Admitting: Physician Assistant

## 2018-07-15 VITALS — BP 139/91 | HR 73 | Temp 97.8°F | Ht 71.0 in | Wt 272.8 lb

## 2018-07-15 DIAGNOSIS — I10 Essential (primary) hypertension: Secondary | ICD-10-CM | POA: Diagnosis not present

## 2018-07-15 DIAGNOSIS — Z23 Encounter for immunization: Secondary | ICD-10-CM

## 2018-07-15 MED ORDER — SPIRONOLACTONE 25 MG PO TABS: 25.0000 mg | ORAL_TABLET | Freq: Every day | ORAL | 3 refills | Status: DC

## 2018-07-18 NOTE — Progress Notes (Signed)
BP (!) 139/91   Pulse 73   Temp 97.8 F (36.6 C) (Oral)   Ht 5\' 11"  (1.803 m)   Wt 272 lb 12.8 oz (123.7 kg)   BMI 38.05 kg/m    Subjective:    Patient ID: Eugene Love, male    DOB: 1986-07-20, 32 y.o.   MRN: [id number]  HPI: Eugene Love is a 32 y.o. male presenting on 07/15/2018 for Hypertension (follow up )  This patient comes in for periodic recheck on medications and conditions including hypertension.  Readings are still being elevated. Had cough with ACE and edema with norvasc.   All medications are reviewed today. There are no reports of any problems with the medications. All of the medical conditions are reviewed and updated.  Lab work is reviewed and will be ordered as medically necessary. There are no new problems reported with today's visit.   Past Medical History:  Diagnosis Date  . Cellulitis    inner thigh   . History of kidney stones   . History of right flank pain   . Hypertension    Relevant past medical, surgical, family and social history reviewed and updated as indicated. Interim medical history since our last visit reviewed. Allergies and medications reviewed and updated. DATA REVIEWED: CHART IN EPIC  Family History reviewed for pertinent findings.  Review of Systems  Constitutional: Negative.  Negative for appetite change and fatigue.  Eyes: Negative for pain and visual disturbance.  Respiratory: Negative.  Negative for cough, chest tightness, shortness of breath and wheezing.   Cardiovascular: Negative.  Negative for chest pain, palpitations and leg swelling.  Gastrointestinal: Negative.  Negative for abdominal pain, diarrhea, nausea and vomiting.  Genitourinary: Negative.   Skin: Negative.  Negative for color change and rash.  Neurological: Negative.  Negative for weakness, numbness and headaches.  Psychiatric/Behavioral: Negative.     Allergies as of 07/15/2018      Reactions   Lisinopril    cough      Medication List        Accurate  as of 07/15/18 11:59 PM. Always use your most recent med list.          carvedilol 10 MG 24 hr capsule Commonly known as:  COREG CR TAKE ONE (1) CAPSULE EACH DAY   clonazePAM 0.5 MG tablet Commonly known as:  KLONOPIN TAKE ONE (1) TABLET AT BEDTIME   spironolactone 25 MG tablet Commonly known as:  ALDACTONE Take 1 tablet (25 mg total) by mouth daily.          Objective:    BP (!) 139/91   Pulse 73   Temp 97.8 F (36.6 C) (Oral)   Ht 5\' 11"  (1.803 m)   Wt 272 lb 12.8 oz (123.7 kg)   BMI 38.05 kg/m   Allergies  Allergen Reactions  . Lisinopril     cough    Wt Readings from Last 3 Encounters:  07/15/18 272 lb 12.8 oz (123.7 kg)  07/01/18 272 lb (123.4 kg)  06/15/18 272 lb 3.2 oz (123.5 kg)    Physical Exam  Constitutional: He appears well-developed and well-nourished. No distress.  HENT:  Head: Normocephalic and atraumatic.  Eyes: Pupils are equal, round, and reactive to light. Conjunctivae and EOM are normal.  Cardiovascular: Normal rate, regular rhythm and normal heart sounds.  Pulmonary/Chest: Effort normal and breath sounds normal. No respiratory distress.  Skin: Skin is warm and dry.  Psychiatric: He has a normal mood and affect. His behavior is  normal.  Nursing note and vitals reviewed.   Results for orders placed or performed during the hospital encounter of 07/01/18  Urinalysis, Routine w reflex microscopic- may I&O cath if menses  Result Value Ref Range   Color, Urine YELLOW YELLOW   APPearance CLEAR CLEAR   Specific Gravity, Urine 1.013 1.005 - 1.030   pH 6.0 5.0 - 8.0   Glucose, UA NEGATIVE NEGATIVE mg/dL   Hgb urine dipstick MODERATE (A) NEGATIVE   Bilirubin Urine NEGATIVE NEGATIVE   Ketones, ur NEGATIVE NEGATIVE mg/dL   Protein, ur NEGATIVE NEGATIVE mg/dL   Nitrite NEGATIVE NEGATIVE   Leukocytes, UA NEGATIVE NEGATIVE   RBC / HPF 21-50 0 - 5 RBC/hpf   WBC, UA 0-5 0 - 5 WBC/hpf   Bacteria, UA NONE SEEN NONE SEEN   Mucus PRESENT     Comprehensive metabolic panel  Result Value Ref Range   Sodium 140 135 - 145 mmol/L   Potassium 4.1 3.5 - 5.1 mmol/L   Chloride 105 98 - 111 mmol/L   CO2 26 22 - 32 mmol/L   Glucose, Bld 106 (H) 70 - 99 mg/dL   BUN 14 6 - 20 mg/dL   Creatinine, Ser 1.611.40 (H) 0.61 - 1.24 mg/dL   Calcium 9.5 8.9 - 09.610.3 mg/dL   Total Protein 8.1 6.5 - 8.1 g/dL   Albumin 4.7 3.5 - 5.0 g/dL   AST 26 15 - 41 U/L   ALT 48 (H) 0 - 44 U/L   Alkaline Phosphatase 64 38 - 126 U/L   Total Bilirubin 0.6 0.3 - 1.2 mg/dL   GFR calc non Af Amer >60 >60 mL/min   GFR calc Af Amer >60 >60 mL/min   Anion gap 9 5 - 15  Lipase, blood  Result Value Ref Range   Lipase 40 11 - 51 U/L  CBC with Differential  Result Value Ref Range   WBC 13.5 (H) 4.0 - 10.5 K/uL   RBC 5.03 4.22 - 5.81 MIL/uL   Hemoglobin 14.8 13.0 - 17.0 g/dL   HCT 04.545.9 40.939.0 - 81.152.0 %   MCV 91.3 80.0 - 100.0 fL   MCH 29.4 26.0 - 34.0 pg   MCHC 32.2 30.0 - 36.0 g/dL   RDW 91.411.7 78.211.5 - 95.615.5 %   Platelets 287 150 - 400 K/uL   nRBC 0.0 0.0 - 0.2 %   Neutrophils Relative % 85 %   Neutro Abs 11.4 (H) 1.7 - 7.7 K/uL   Lymphocytes Relative 9 %   Lymphs Abs 1.1 0.7 - 4.0 K/uL   Monocytes Relative 5 %   Monocytes Absolute 0.7 0.1 - 1.0 K/uL   Eosinophils Relative 1 %   Eosinophils Absolute 0.1 0.0 - 0.5 K/uL   Basophils Relative 0 %   Basophils Absolute 0.1 0.0 - 0.1 K/uL   Immature Granulocytes 0 %   Abs Immature Granulocytes 0.06 0.00 - 0.07 K/uL      Assessment & Plan:   1. Need for immunization against influenza - Flu Vaccine QUAD 36+ mos IM  2. Essential hypertension - spironolactone (ALDACTONE) 25 MG tablet; Take 1 tablet (25 mg total) by mouth daily.  Dispense: 90 tablet; Refill: 3   Continue all other maintenance medications as listed above.  Follow up plan: Return in about 4 weeks (around 08/12/2018) for recheck.  Educational handout given for survey  Remus LofflerAngel S. Retta Pitcher PA-C [facility] [address]   [place], [place] [id number] [phone]   07/18/2018, 4:16 PM\

## 2018-08-17 ENCOUNTER — Ambulatory Visit: Payer: BC Managed Care – PPO | Admitting: Physician Assistant

## 2018-08-17 ENCOUNTER — Encounter: Payer: Self-pay | Admitting: Physician Assistant

## 2018-08-17 VITALS — BP 131/86 | HR 85 | Temp 97.5°F | Ht 71.0 in | Wt 283.0 lb

## 2018-08-17 DIAGNOSIS — Z0000 Encounter for general adult medical examination without abnormal findings: Secondary | ICD-10-CM

## 2018-08-17 DIAGNOSIS — F5101 Primary insomnia: Secondary | ICD-10-CM

## 2018-08-17 DIAGNOSIS — I10 Essential (primary) hypertension: Secondary | ICD-10-CM | POA: Diagnosis not present

## 2018-08-18 LAB — CMP14+EGFR
A/G RATIO: 1.7 (ref 1.2–2.2)
ALT: 46 IU/L — AB (ref 0–44)
AST: 22 IU/L (ref 0–40)
Albumin: 4.3 g/dL (ref 3.5–5.5)
Alkaline Phosphatase: 62 IU/L (ref 39–117)
BILIRUBIN TOTAL: 0.2 mg/dL (ref 0.0–1.2)
BUN/Creatinine Ratio: 12 (ref 9–20)
BUN: 11 mg/dL (ref 6–20)
CALCIUM: 9.5 mg/dL (ref 8.7–10.2)
CHLORIDE: 99 mmol/L (ref 96–106)
CO2: 22 mmol/L (ref 20–29)
Creatinine, Ser: 0.93 mg/dL (ref 0.76–1.27)
GFR calc Af Amer: 127 mL/min/{1.73_m2} (ref >59)
GFR, EST NON AFRICAN AMERICAN: 110 mL/min/{1.73_m2} (ref >59)
GLOBULIN, TOTAL: 2.6 g/dL (ref 1.5–4.5)
Glucose: 94 mg/dL (ref 65–99)
POTASSIUM: 4.8 mmol/L (ref 3.5–5.2)
SODIUM: 141 mmol/L (ref 134–144)
Total Protein: 6.9 g/dL (ref 6.0–8.5)

## 2018-08-18 LAB — CBC WITH DIFFERENTIAL/PLATELET
BASOS: 1 %
Basophils Absolute: 0 10*3/uL (ref 0.0–0.2)
EOS (ABSOLUTE): 0.2 10*3/uL (ref 0.0–0.4)
Eos: 4 %
HEMATOCRIT: 42.9 % (ref 37.5–51.0)
Hemoglobin: 14.6 g/dL (ref 13.0–17.7)
IMMATURE GRANULOCYTES: 0 %
Immature Grans (Abs): 0 10*3/uL (ref 0.0–0.1)
Lymphocytes Absolute: 2.2 10*3/uL (ref 0.7–3.1)
Lymphs: 39 %
MCH: 28.5 pg (ref 26.6–33.0)
MCHC: 34 g/dL (ref 31.5–35.7)
MCV: 84 fL (ref 79–97)
Monocytes Absolute: 0.5 10*3/uL (ref 0.1–0.9)
Monocytes: 9 %
NEUTROS PCT: 47 %
Neutrophils Absolute: 2.6 10*3/uL (ref 1.4–7.0)
PLATELETS: 283 10*3/uL (ref 150–450)
RBC: 5.12 x10E6/uL (ref 4.14–5.80)
RDW: 11.7 % — AB (ref 12.3–15.4)
WBC: 5.5 10*3/uL (ref 3.4–10.8)

## 2018-08-18 LAB — LIPID PANEL
CHOL/HDL RATIO: 5.6 ratio — AB (ref 0.0–5.0)
CHOLESTEROL TOTAL: 211 mg/dL — AB (ref 100–199)
HDL: 38 mg/dL — AB (ref >39)
LDL Calculated: 127 mg/dL — ABNORMAL HIGH (ref 0–99)
TRIGLYCERIDES: 232 mg/dL — AB (ref 0–149)
VLDL Cholesterol Cal: 46 mg/dL — ABNORMAL HIGH (ref 5–40)

## 2018-08-18 LAB — TSH: TSH: 1.32 u[IU]/mL (ref 0.450–4.500)

## 2018-08-24 NOTE — Progress Notes (Signed)
BP 131/86 (BP Location: Right Arm, Patient Position: Sitting, Cuff Size: Large)   Pulse 85   Temp (!) 97.5 F (36.4 C) (Oral)   Ht '5\' 11"'  (1.803 m)   Wt 283 lb (128.4 kg)   BMI 39.47 kg/m    Subjective:    Patient ID: Eugene Love, male    DOB: [birth year], 32 y.o.   MRN: [id number]  HPI: Eugene Love is a 32 y.o. male presenting on 08/17/2018 for Hypertension (4 week f/u)  This patient comes in for periodic recheck on medications and conditions including hypertension, insomnia.  He is here for some well exam for his labs.  He is tolerated the medication very well and had very good blood pressure readings.  We will plan to see him back in a few months..   All medications are reviewed today. There are no reports of any problems with the medications. All of the medical conditions are reviewed and updated.  Lab work is reviewed and will be ordered as medically necessary. There are no new problems reported with today's visit.   Past Medical History:  Diagnosis Date  . Cellulitis    inner thigh   . History of kidney stones   . History of right flank pain   . Hypertension    Relevant past medical, surgical, family and social history reviewed and updated as indicated. Interim medical history since our last visit reviewed. Allergies and medications reviewed and updated. DATA REVIEWED: CHART IN EPIC  Family History reviewed for pertinent findings.  Review of Systems  Constitutional: Negative.  Negative for appetite change and fatigue.  Eyes: Negative for pain and visual disturbance.  Respiratory: Negative.  Negative for cough, chest tightness, shortness of breath and wheezing.   Cardiovascular: Negative.  Negative for chest pain, palpitations and leg swelling.  Gastrointestinal: Negative.  Negative for abdominal pain, diarrhea, nausea and vomiting.  Genitourinary: Negative.   Skin: Negative.  Negative for color change and rash.  Neurological: Negative.  Negative for weakness,  numbness and headaches.  Psychiatric/Behavioral: Negative.     Allergies as of 08/17/2018      Reactions   Lisinopril    cough   Norvasc [amlodipine Besylate]    edema      Medication List       Accurate as of August 17, 2018 11:59 PM. Always use your most recent med list.        carvedilol 10 MG 24 hr capsule Commonly known as:  COREG CR TAKE ONE (1) CAPSULE EACH DAY   clonazePAM 0.5 MG tablet Commonly known as:  KLONOPIN TAKE ONE (1) TABLET AT BEDTIME   FISH OIL PO Take 1 capsule by mouth daily.   spironolactone 25 MG tablet Commonly known as:  ALDACTONE Take 1 tablet (25 mg total) by mouth daily.          Objective:    BP 131/86 (BP Location: Right Arm, Patient Position: Sitting, Cuff Size: Large)   Pulse 85   Temp (!) 97.5 F (36.4 C) (Oral)   Ht '5\' 11"'  (1.803 m)   Wt 283 lb (128.4 kg)   BMI 39.47 kg/m   Allergies  Allergen Reactions  . Lisinopril     cough  . Norvasc [Amlodipine Besylate]     edema    Wt Readings from Last 3 Encounters:  08/17/18 283 lb (128.4 kg)  07/15/18 272 lb 12.8 oz (123.7 kg)  07/01/18 272 lb (123.4 kg)    Physical Exam Vitals signs and  nursing note reviewed.  Constitutional:      General: He is not in acute distress.    Appearance: He is well-developed.  HENT:     Head: Normocephalic and atraumatic.  Eyes:     Conjunctiva/sclera: Conjunctivae normal.     Pupils: Pupils are equal, round, and reactive to light.  Cardiovascular:     Rate and Rhythm: Normal rate and regular rhythm.     Heart sounds: Normal heart sounds.  Pulmonary:     Effort: Pulmonary effort is normal. No respiratory distress.     Breath sounds: Normal breath sounds.  Skin:    General: Skin is warm and dry.  Psychiatric:        Behavior: Behavior normal.     Results for orders placed or performed in visit on 08/17/18  CBC with Differential/Platelet  Result Value Ref Range   WBC 5.5 3.4 - 10.8 x10E3/uL   RBC 5.12 4.14 - 5.80 x10E6/uL     Hemoglobin 14.6 13.0 - 17.7 g/dL   Hematocrit 42.9 37.5 - 51.0 %   MCV 84 79 - 97 fL   MCH 28.5 26.6 - 33.0 pg   MCHC 34.0 31.5 - 35.7 g/dL   RDW 11.7 (L) 12.3 - 15.4 %   Platelets 283 150 - 450 x10E3/uL   Neutrophils 47 Not Estab. %   Lymphs 39 Not Estab. %   Monocytes 9 Not Estab. %   Eos 4 Not Estab. %   Basos 1 Not Estab. %   Neutrophils Absolute 2.6 1.4 - 7.0 x10E3/uL   Lymphocytes Absolute 2.2 0.7 - 3.1 x10E3/uL   Monocytes Absolute 0.5 0.1 - 0.9 x10E3/uL   EOS (ABSOLUTE) 0.2 0.0 - 0.4 x10E3/uL   Basophils Absolute 0.0 0.0 - 0.2 x10E3/uL   Immature Granulocytes 0 Not Estab. %   Immature Grans (Abs) 0.0 0.0 - 0.1 x10E3/uL  CMP14+EGFR  Result Value Ref Range   Glucose 94 65 - 99 mg/dL   BUN 11 6 - 20 mg/dL   Creatinine, Ser 0.93 0.76 - 1.27 mg/dL   GFR calc non Af Amer 110 >59 mL/min/1.73   GFR calc Af Amer 127 >59 mL/min/1.73   BUN/Creatinine Ratio 12 9 - 20   Sodium 141 134 - 144 mmol/L   Potassium 4.8 3.5 - 5.2 mmol/L   Chloride 99 96 - 106 mmol/L   CO2 22 20 - 29 mmol/L   Calcium 9.5 8.7 - 10.2 mg/dL   Total Protein 6.9 6.0 - 8.5 g/dL   Albumin 4.3 3.5 - 5.5 g/dL   Globulin, Total 2.6 1.5 - 4.5 g/dL   Albumin/Globulin Ratio 1.7 1.2 - 2.2   Bilirubin Total 0.2 0.0 - 1.2 mg/dL   Alkaline Phosphatase 62 39 - 117 IU/L   AST 22 0 - 40 IU/L   ALT 46 (H) 0 - 44 IU/L  Lipid panel  Result Value Ref Range   Cholesterol, Total 211 (H) 100 - 199 mg/dL   Triglycerides 232 (H) 0 - 149 mg/dL   HDL 38 (L) >39 mg/dL   VLDL Cholesterol Cal 46 (H) 5 - 40 mg/dL   LDL Calculated 127 (H) 0 - 99 mg/dL   Chol/HDL Ratio 5.6 (H) 0.0 - 5.0 ratio  TSH  Result Value Ref Range   TSH 1.320 0.450 - 4.500 uIU/mL      Assessment & Plan:   1. Essential hypertension Continue medications  2. Primary insomnia Continue klonopin  3. Well adult exam - CBC with Differential/Platelet -  CMP14+EGFR - Lipid panel - TSH   Continue all other maintenance medications as listed  above.  Follow up plan: Return in about 6 months (around 02/16/2019).  Educational handout given for [place] PA-C [facility] [address]  [place],  [address] [phone]   08/24/2018, 4:50 PM

## 2018-11-09 ENCOUNTER — Telehealth: Payer: Self-pay | Admitting: Physician Assistant

## 2018-11-09 NOTE — Telephone Encounter (Signed)
Patient due for a follow up in June, wondering if you can refill his Clonazepam until then.  He uses [facility] in [place].  Also, he has noticed he is having to get up frequently at night to urinate.  He researched and saw online that [employer] will help with this but he is wondering if this will cause any problems with his regular medications.  Please advise.

## 2018-11-09 NOTE — Telephone Encounter (Signed)
Pt is wanting to talk to nurse about all his medications and the interactions that can be caused if any between them.

## 2018-11-18 IMAGING — DX DG ABDOMEN 1V
2 series · 2 of 2 positions shown · non-contrast
Comparison: None.

CLINICAL DATA: Right upper quadrant pain and hematuria. History of
urinary tract stones.

EXAM:
ABDOMEN - 1 VIEW

[abdomen kub (1 of 2)]
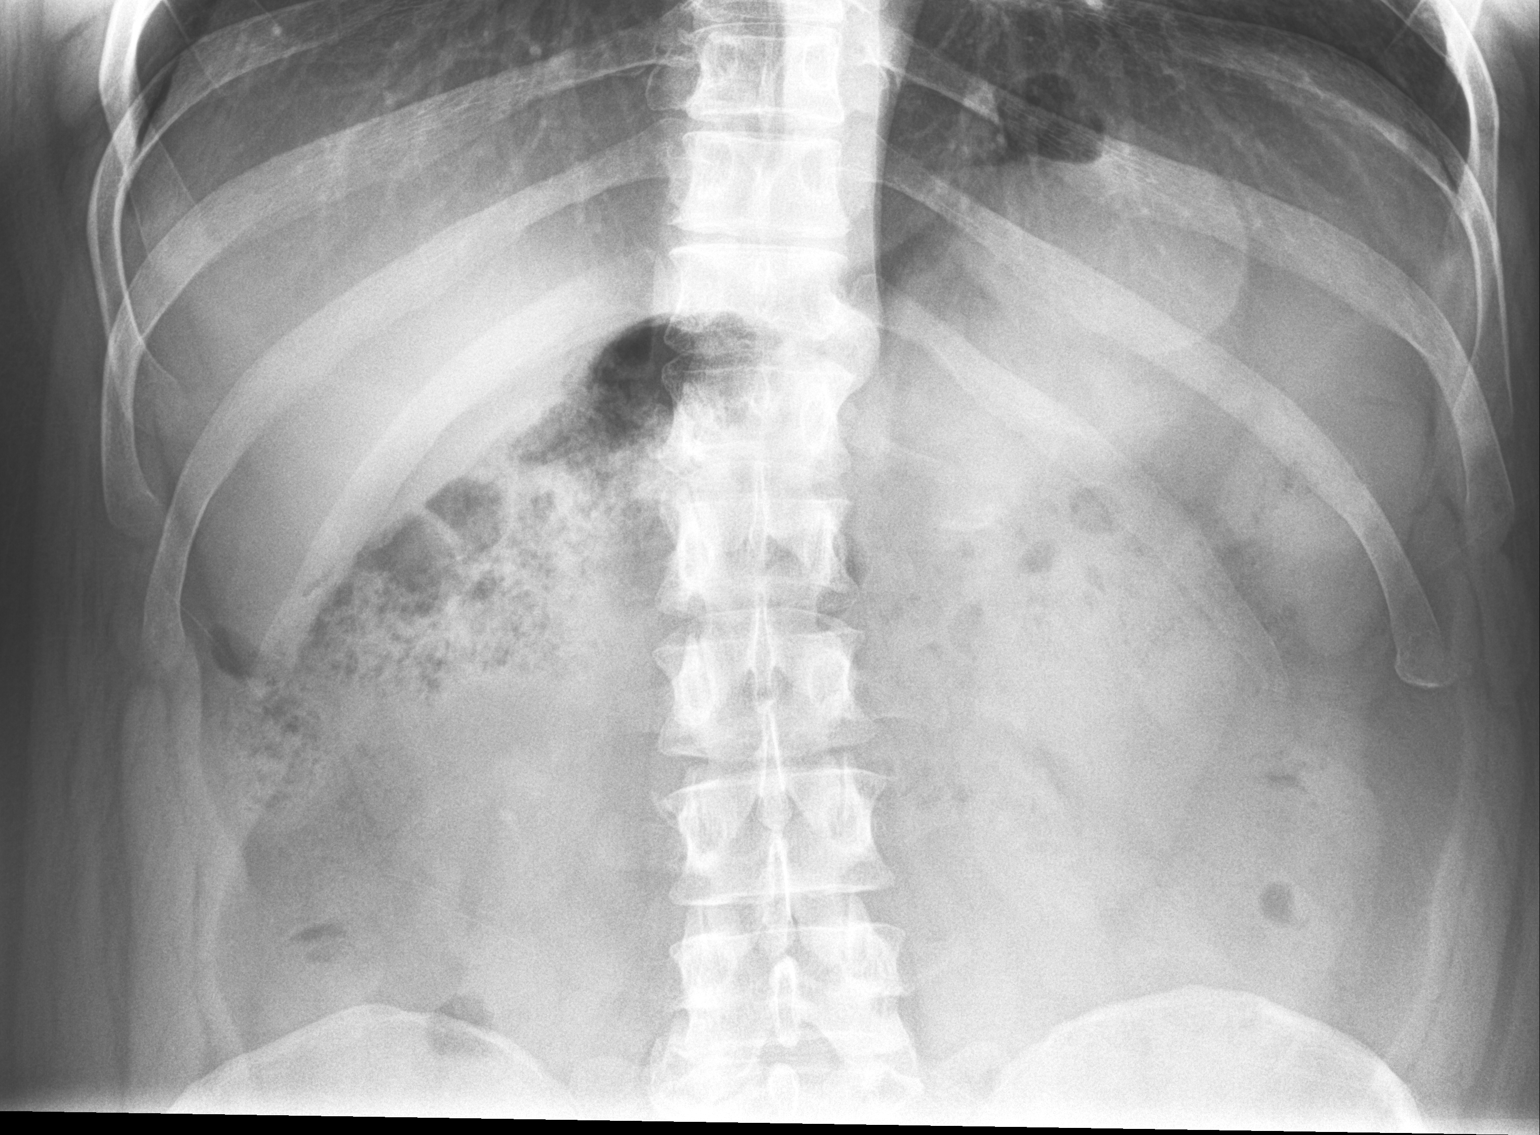

[abdomen kub (2 of 2)]
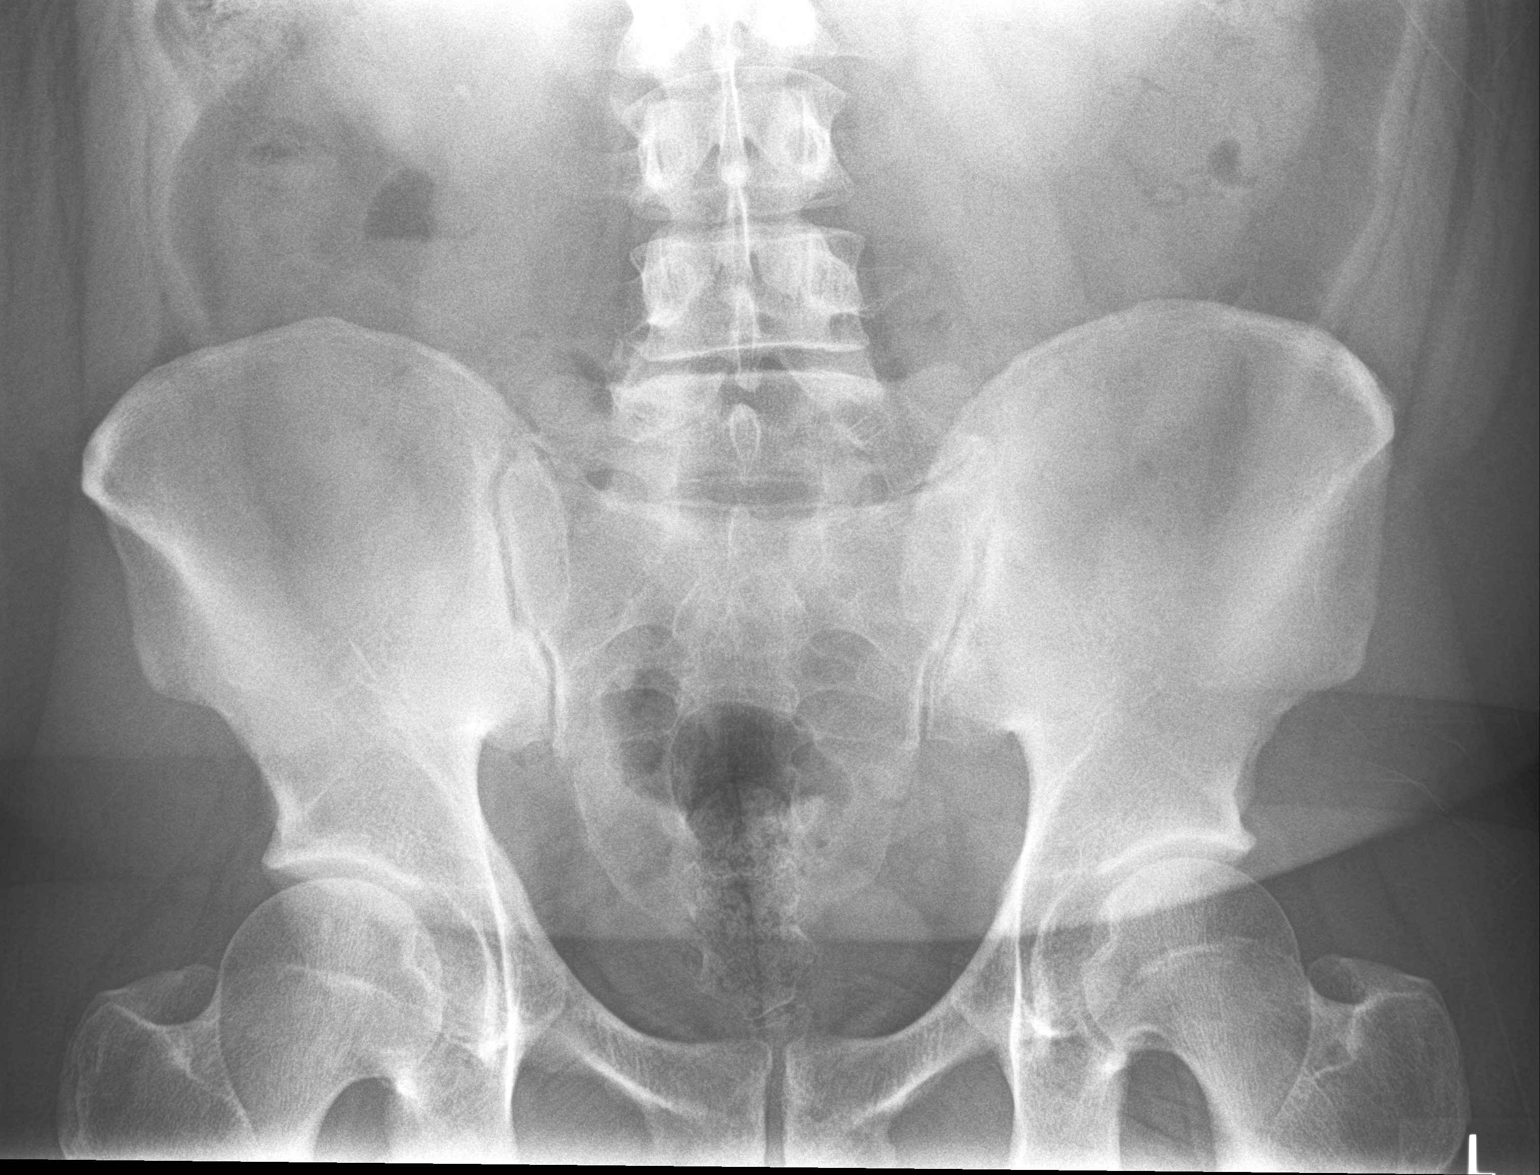

[2 of 2 positions shown; findings below may reference images not displayed]

FINDINGS: 0.5 cm calcification projecting over the lower pole of the right
kidney is consistent with a nonobstructing stone. No other evidence
of urinary tract stones identified. Bowel gas pattern is normal. No
acute bony abnormality.
IMPRESSION: Findings compatible with a 0.5 cm nonobstructing stone lower pole
right kidney.

## 2019-01-25 ENCOUNTER — Other Ambulatory Visit: Payer: Self-pay | Admitting: Physician Assistant

## 2019-02-18 ENCOUNTER — Other Ambulatory Visit: Payer: Self-pay

## 2019-02-19 ENCOUNTER — Ambulatory Visit (INDEPENDENT_AMBULATORY_CARE_PROVIDER_SITE_OTHER): Payer: BC Managed Care – PPO | Admitting: Physician Assistant

## 2019-02-19 ENCOUNTER — Encounter: Payer: Self-pay | Admitting: Physician Assistant

## 2019-02-19 VITALS — BP 141/82 | HR 80 | Temp 97.3°F | Ht 71.0 in | Wt 262.6 lb

## 2019-02-19 DIAGNOSIS — I10 Essential (primary) hypertension: Secondary | ICD-10-CM | POA: Diagnosis not present

## 2019-02-19 DIAGNOSIS — Z0000 Encounter for general adult medical examination without abnormal findings: Secondary | ICD-10-CM

## 2019-02-19 DIAGNOSIS — Z0001 Encounter for general adult medical examination with abnormal findings: Secondary | ICD-10-CM

## 2019-02-19 MED ORDER — SPIRONOLACTONE 25 MG PO TABS: 25.0000 mg | ORAL_TABLET | Freq: Every day | ORAL | 3 refills | Status: DC

## 2019-02-19 MED ORDER — CARVEDILOL PHOSPHATE ER 20 MG PO CP24: 20.0000 mg | ORAL_CAPSULE | Freq: Every day | ORAL | 5 refills | Status: DC

## 2019-02-19 NOTE — Patient Instructions (Signed)
DASH Eating Plan  DASH stands for "Dietary Approaches to Stop Hypertension." The DASH eating plan is a healthy eating plan that has been shown to reduce high blood pressure (hypertension). It may also reduce your risk for type 2 diabetes, heart disease, and stroke. The DASH eating plan may also help with weight loss.  What are tips for following this plan?    General guidelines   Avoid eating more than 2,300 mg (milligrams) of salt (sodium) a day. If you have hypertension, you may need to reduce your sodium intake to 1,500 mg a day.   Limit alcohol intake to no more than 1 drink a day for nonpregnant women and 2 drinks a day for men. One drink equals 12 oz of beer, 5 oz of wine, or 1 oz of hard liquor.   Work with your health care provider to maintain a healthy body weight or to lose weight. Ask what an ideal weight is for you.   Get at least 30 minutes of exercise that causes your heart to beat faster (aerobic exercise) most days of the week. Activities may include walking, swimming, or biking.   Work with your health care provider or diet and nutrition specialist (dietitian) to adjust your eating plan to your individual calorie needs.  Reading food labels     Check food labels for the amount of sodium per serving. Choose foods with less than 5 percent of the Daily Value of sodium. Generally, foods with less than 300 mg of sodium per serving fit into this eating plan.   To find whole grains, look for the word "whole" as the first word in the ingredient list.  Shopping   Buy products labeled as "low-sodium" or "no salt added."   Buy fresh foods. Avoid canned foods and premade or frozen meals.  Cooking   Avoid adding salt when cooking. Use salt-free seasonings or herbs instead of table salt or sea salt. Check with your health care provider or pharmacist before using salt substitutes.   Do not fry foods. Cook foods using healthy methods such as baking, boiling, grilling, and broiling instead.   Cook with  heart-healthy oils, such as olive, canola, soybean, or sunflower oil.  Meal planning   Eat a balanced diet that includes:  ? 5 or more servings of fruits and vegetables each day. At each meal, try to fill half of your plate with fruits and vegetables.  ? Up to 6-8 servings of whole grains each day.  ? Less than 6 oz of lean meat, poultry, or fish each day. A 3-oz serving of meat is about the same size as a deck of cards. One egg equals 1 oz.  ? 2 servings of low-fat dairy each day.  ? A serving of nuts, seeds, or beans 5 times each week.  ? Heart-healthy fats. Healthy fats called Omega-3 fatty acids are found in foods such as flaxseeds and coldwater fish, like sardines, salmon, and mackerel.   Limit how much you eat of the following:  ? Canned or prepackaged foods.  ? Food that is high in trans fat, such as fried foods.  ? Food that is high in saturated fat, such as fatty meat.  ? Sweets, desserts, sugary drinks, and other foods with added sugar.  ? Full-fat dairy products.   Do not salt foods before eating.   Try to eat at least 2 vegetarian meals each week.   Eat more home-cooked food and less restaurant, buffet, and fast food.     When eating at a restaurant, ask that your food be prepared with less salt or no salt, if possible.  What foods are recommended?  The items listed may not be a complete list. Talk with your dietitian about what dietary choices are best for you.  Grains  Whole-grain or whole-wheat bread. Whole-grain or whole-wheat pasta. Brown rice. Oatmeal. Quinoa. Bulgur. Whole-grain and low-sodium cereals. Pita bread. Low-fat, low-sodium crackers. Whole-wheat flour tortillas.  Vegetables  Fresh or frozen vegetables (raw, steamed, roasted, or grilled). Low-sodium or reduced-sodium tomato and vegetable juice. Low-sodium or reduced-sodium tomato sauce and tomato paste. Low-sodium or reduced-sodium canned vegetables.  Fruits  All fresh, dried, or frozen fruit. Canned fruit in natural juice (without  added sugar).  Meat and other protein foods  Skinless chicken or turkey. Ground chicken or turkey. Pork with fat trimmed off. Fish and seafood. Egg whites. Dried beans, peas, or lentils. Unsalted nuts, nut butters, and seeds. Unsalted canned beans. Lean cuts of beef with fat trimmed off. Low-sodium, lean deli meat.  Dairy  Low-fat (1%) or fat-free (skim) milk. Fat-free, low-fat, or reduced-fat cheeses. Nonfat, low-sodium ricotta or cottage cheese. Low-fat or nonfat yogurt. Low-fat, low-sodium cheese.  Fats and oils  Soft margarine without trans fats. Vegetable oil. Low-fat, reduced-fat, or light mayonnaise and salad dressings (reduced-sodium). Canola, safflower, olive, soybean, and sunflower oils. Avocado.  Seasoning and other foods  Herbs. Spices. Seasoning mixes without salt. Unsalted popcorn and pretzels. Fat-free sweets.  What foods are not recommended?  The items listed may not be a complete list. Talk with your dietitian about what dietary choices are best for you.  Grains  Baked goods made with fat, such as croissants, muffins, or some breads. Dry pasta or rice meal packs.  Vegetables  Creamed or fried vegetables. Vegetables in a cheese sauce. Regular canned vegetables (not low-sodium or reduced-sodium). Regular canned tomato sauce and paste (not low-sodium or reduced-sodium). Regular tomato and vegetable juice (not low-sodium or reduced-sodium). Pickles. Olives.  Fruits  Canned fruit in a light or heavy syrup. Fried fruit. Fruit in cream or butter sauce.  Meat and other protein foods  Fatty cuts of meat. Ribs. Fried meat. Bacon. Sausage. Bologna and other processed lunch meats. Salami. Fatback. Hotdogs. Bratwurst. Salted nuts and seeds. Canned beans with added salt. Canned or smoked fish. Whole eggs or egg yolks. Chicken or turkey with skin.  Dairy  Whole or 2% milk, cream, and half-and-half. Whole or full-fat cream cheese. Whole-fat or sweetened yogurt. Full-fat cheese. Nondairy creamers. Whipped toppings.  Processed cheese and cheese spreads.  Fats and oils  Butter. Stick margarine. Lard. Shortening. Ghee. Bacon fat. Tropical oils, such as coconut, palm kernel, or palm oil.  Seasoning and other foods  Salted popcorn and pretzels. Onion salt, garlic salt, seasoned salt, table salt, and sea salt. Worcestershire sauce. Tartar sauce. Barbecue sauce. Teriyaki sauce. Soy sauce, including reduced-sodium. Steak sauce. Canned and packaged gravies. Fish sauce. Oyster sauce. Cocktail sauce. Horseradish that you find on the shelf. Ketchup. Mustard. Meat flavorings and tenderizers. Bouillon cubes. Hot sauce and Tabasco sauce. Premade or packaged marinades. Premade or packaged taco seasonings. Relishes. Regular salad dressings.  Where to find more information:   National Heart, Lung, and Blood Institute: www.nhlbi.nih.gov   American Heart Association: www.heart.org  Summary   The DASH eating plan is a healthy eating plan that has been shown to reduce high blood pressure (hypertension). It may also reduce your risk for type 2 diabetes, heart disease, and stroke.   With the   DASH eating plan, you should limit salt (sodium) intake to 2,300 mg a day. If you have hypertension, you may need to reduce your sodium intake to 1,500 mg a day.   When on the DASH eating plan, aim to eat more fresh fruits and vegetables, whole grains, lean proteins, low-fat dairy, and heart-healthy fats.   Work with your health care provider or diet and nutrition specialist (dietitian) to adjust your eating plan to your individual calorie needs.  This information is not intended to replace advice given to you by your health care provider. Make sure you discuss any questions you have with your health care provider.  Document Released: 08/01/2011 Document Revised: 08/05/2016 Document Reviewed: 08/05/2016  Elsevier Interactive Patient Education  2019 [employer] Inc[employer]

## 2019-02-22 ENCOUNTER — Encounter: Payer: Self-pay | Admitting: Physician Assistant

## 2019-02-22 NOTE — Progress Notes (Signed)
BP (!) 141/82   Pulse 80   Temp (!) 97.3 F (36.3 C) (Oral)   Ht _0  (1.803 m)   Wt 262 lb 9.6 oz (119.1 kg)   BMI 36.63 kg/m    Subjective:    Patient ID: Eugene Love, male    DOB: [birth year], 33 y.o.   MRN: [id number]  HPI: Eugene Love is a 33 y.o. male presenting on 02/19/2019 for Annual Exam This patient comes in for annual well physical examination. All medications are reviewed today. There are no reports of any problems with the medications. All of the medical conditions are reviewed and updated.  Lab work is reviewed and will be ordered as medically necessary.   Patient reports that he has had very good blood pressure readings everywhere he has been recently.  He is feeling very good on these medications.  He is still working on diet and exercise.  He is down 18 pounds.  I have commended him on the good efforts.  ANXIETY ASSESSMENT Cause of anxiety: GAD, interrupted sleep This patient returns for a  month recheck on narcotic use for the above named condition(s)  Current medications-clonazepam 0.5 mg 1 at bedtime Other medications tried: All over-the-counter sleeping agents Medication side effects-none Any concerns-no Any change in general medical condition- no Effectiveness of current meds-good PMP AWARE website reviewed: Yes Any suspicious activity on PMP Aware: No LME daily dose: 1 He has had 5 fills in the past year.  No contract or urine drug screen has been started  History of overdose or risk of abuse no   Past Medical History:  Diagnosis Date  . Cellulitis    inner thigh   . History of kidney stones   . History of right flank pain   . Hypertension    Relevant past medical, surgical, family and social history reviewed and updated as indicated. Interim medical history since our last visit reviewed. Allergies and medications reviewed and updated. DATA REVIEWED: CHART IN EPIC  Family History reviewed for pertinent findings.  Review of Systems   Constitutional: Negative.  Negative for appetite change and fatigue.  Eyes: Negative for pain and visual disturbance.  Respiratory: Negative.  Negative for cough, chest tightness, shortness of breath and wheezing.   Cardiovascular: Negative.  Negative for chest pain, palpitations and leg swelling.  Gastrointestinal: Negative.  Negative for abdominal pain, diarrhea, nausea and vomiting.  Genitourinary: Negative.   Skin: Negative.  Negative for color change and rash.  Neurological: Negative.  Negative for weakness, numbness and headaches.  Psychiatric/Behavioral: Negative.     Allergies as of 02/19/2019      Reactions   Lisinopril    cough   Norvasc [amlodipine Besylate]    edema      Medication List       Accurate as of February 19, 2019 11:59 PM. If you have any questions, ask your nurse or doctor.        APPLE CIDER VINEGAR PO Take by mouth.   carvedilol 20 MG 24 hr capsule Commonly known as: COREG CR Take 1 capsule (20 mg total) by mouth daily. What changed:   medication strength  See the new instructions. Changed by: Terald Sleeper, PA-C   clonazePAM 0.5 MG tablet Commonly known as: KLONOPIN TAKE ONE TABLET DAILY AT BEDTIME   FISH OIL PO Take 1 capsule by mouth daily.   multivitamin with minerals tablet Take 1 tablet by mouth daily.   saw palmetto 500 MG capsule Take 500  mg by mouth daily.   spironolactone 25 MG tablet Commonly known as: ALDACTONE Take 1 tablet (25 mg total) by mouth daily.          Objective:    BP (!) 141/82   Pulse 80   Temp (!) 97.3 F (36.3 C) (Oral)   Ht _0  (1.803 m)   Wt 262 lb 9.6 oz (119.1 kg)   BMI 36.63 kg/m   Allergies  Allergen Reactions  . Lisinopril     cough  . Norvasc [Amlodipine Besylate]     edema    Wt Readings from Last 3 Encounters:  02/19/19 262 lb 9.6 oz (119.1 kg)  08/17/18 283 lb (128.4 kg)  07/15/18 272 lb 12.8 oz (123.7 kg)    Physical Exam Constitutional:      Appearance: He is  well-developed.  HENT:     Head: Normocephalic and atraumatic.  Eyes:     Conjunctiva/sclera: Conjunctivae normal.     Pupils: Pupils are equal, round, and reactive to light.  Neck:     Musculoskeletal: Normal range of motion and neck supple.  Cardiovascular:     Rate and Rhythm: Normal rate and regular rhythm.     Heart sounds: Normal heart sounds.  Pulmonary:     Effort: Pulmonary effort is normal.     Breath sounds: Normal breath sounds.  Abdominal:     General: Bowel sounds are normal.     Palpations: Abdomen is soft.  Musculoskeletal: Normal range of motion.  Skin:    General: Skin is warm and dry.         Assessment & Plan:   1. Essential hypertension - carvedilol (COREG CR) 20 MG 24 hr capsule; Take 1 capsule (20 mg total) by mouth daily.  Dispense: 30 capsule; Refill: 5 - spironolactone (ALDACTONE) 25 MG tablet; Take 1 tablet (25 mg total) by mouth daily.  Dispense: 90 tablet; Refill: 3  2. Well adult exam - CMP14+EGFR; Future - CBC with Differential/Platelet; Future - Lipid panel; Future - TSH; Future   Continue all other maintenance medications as listed above.  Follow up plan: No follow-ups on file.  Educational handout given for well adult  Terald Sleeper PA-C [facility] [address]  [place], [place] [address] [phone]   02/22/2019, 4:31 PM

## 2019-03-01 ENCOUNTER — Other Ambulatory Visit: Payer: BC Managed Care – PPO

## 2019-03-01 ENCOUNTER — Other Ambulatory Visit: Payer: Self-pay

## 2019-03-01 DIAGNOSIS — Z0000 Encounter for general adult medical examination without abnormal findings: Secondary | ICD-10-CM

## 2019-03-02 ENCOUNTER — Other Ambulatory Visit: Payer: Self-pay | Admitting: Physician Assistant

## 2019-03-02 DIAGNOSIS — R945 Abnormal results of liver function studies: Secondary | ICD-10-CM

## 2019-03-02 LAB — CMP14+EGFR
ALT: 82 IU/L — ABNORMAL HIGH (ref 0–44)
AST: 50 IU/L — ABNORMAL HIGH (ref 0–40)
Albumin/Globulin Ratio: 1.7 (ref 1.2–2.2)
Albumin: 4.7 g/dL (ref 4.0–5.0)
Alkaline Phosphatase: 56 IU/L (ref 39–117)
BUN/Creatinine Ratio: 13 (ref 9–20)
BUN: 14 mg/dL (ref 6–20)
Bilirubin Total: 0.6 mg/dL (ref 0.0–1.2)
CO2: 22 mmol/L (ref 20–29)
Calcium: 9.5 mg/dL (ref 8.7–10.2)
Chloride: 101 mmol/L (ref 96–106)
Creatinine, Ser: 1.05 mg/dL (ref 0.76–1.27)
GFR calc Af Amer: 109 mL/min/{1.73_m2} (ref >59)
GFR calc non Af Amer: 94 mL/min/{1.73_m2} (ref >59)
Globulin, Total: 2.8 g/dL (ref 1.5–4.5)
Glucose: 90 mg/dL (ref 65–99)
Potassium: 4.6 mmol/L (ref 3.5–5.2)
Sodium: 137 mmol/L (ref 134–144)
Total Protein: 7.5 g/dL (ref 6.0–8.5)

## 2019-03-02 LAB — CBC WITH DIFFERENTIAL/PLATELET
Basophils Absolute: 0 10*3/uL (ref 0.0–0.2)
Basos: 1 %
EOS (ABSOLUTE): 0.2 10*3/uL (ref 0.0–0.4)
Eos: 3 %
Hematocrit: 44.1 % (ref 37.5–51.0)
Hemoglobin: 15.2 g/dL (ref 13.0–17.7)
Immature Grans (Abs): 0 10*3/uL (ref 0.0–0.1)
Immature Granulocytes: 1 %
Lymphocytes Absolute: 1.9 10*3/uL (ref 0.7–3.1)
Lymphs: 30 %
MCH: 30.7 pg (ref 26.6–33.0)
MCHC: 34.5 g/dL (ref 31.5–35.7)
MCV: 89 fL (ref 79–97)
Monocytes Absolute: 0.5 10*3/uL (ref 0.1–0.9)
Monocytes: 7 %
Neutrophils Absolute: 3.7 10*3/uL (ref 1.4–7.0)
Neutrophils: 58 %
Platelets: 255 10*3/uL (ref 150–450)
RBC: 4.95 x10E6/uL (ref 4.14–5.80)
RDW: 11.9 % (ref 11.6–15.4)
WBC: 6.4 10*3/uL (ref 3.4–10.8)

## 2019-03-02 LAB — LIPID PANEL
Chol/HDL Ratio: 5.6 ratio — ABNORMAL HIGH (ref 0.0–5.0)
Cholesterol, Total: 229 mg/dL — ABNORMAL HIGH (ref 100–199)
HDL: 41 mg/dL (ref >39)
LDL Calculated: 147 mg/dL — ABNORMAL HIGH (ref 0–99)
Triglycerides: 207 mg/dL — ABNORMAL HIGH (ref 0–149)
VLDL Cholesterol Cal: 41 mg/dL — ABNORMAL HIGH (ref 5–40)

## 2019-03-02 LAB — TSH: TSH: 1.62 u[IU]/mL (ref 0.450–4.500)

## 2019-03-02 MED ORDER — ATORVASTATIN CALCIUM 20 MG PO TABS: 20.0000 mg | ORAL_TABLET | Freq: Every day | ORAL | 3 refills | Status: DC

## 2019-03-10 ENCOUNTER — Ambulatory Visit (HOSPITAL_COMMUNITY): Payer: BC Managed Care – PPO

## 2019-04-30 ENCOUNTER — Other Ambulatory Visit: Payer: Self-pay | Admitting: Physician Assistant

## 2019-04-30 NOTE — Telephone Encounter (Signed)
Controlled. Deferred to PCP

## 2019-05-03 ENCOUNTER — Other Ambulatory Visit: Payer: Self-pay | Admitting: Physician Assistant

## 2019-05-03 MED ORDER — CLONAZEPAM 0.5 MG PO TABS: ORAL_TABLET | ORAL | 2 refills | Status: DC

## 2019-05-03 MED ORDER — VALACYCLOVIR HCL 1 G PO TABS: 1000.0000 mg | ORAL_TABLET | Freq: Every day | ORAL | 3 refills | Status: DC

## 2019-05-24 ENCOUNTER — Other Ambulatory Visit: Payer: Self-pay

## 2019-05-25 ENCOUNTER — Ambulatory Visit: Payer: BC Managed Care – PPO | Admitting: Physician Assistant

## 2019-05-25 ENCOUNTER — Other Ambulatory Visit: Payer: Self-pay

## 2019-05-25 ENCOUNTER — Encounter: Payer: Self-pay | Admitting: Physician Assistant

## 2019-05-25 VITALS — BP 139/85 | HR 71 | Temp 97.3°F | Ht 71.0 in | Wt 272.8 lb

## 2019-05-25 DIAGNOSIS — Z23 Encounter for immunization: Secondary | ICD-10-CM

## 2019-05-25 DIAGNOSIS — F5101 Primary insomnia: Secondary | ICD-10-CM

## 2019-05-25 DIAGNOSIS — I10 Essential (primary) hypertension: Secondary | ICD-10-CM

## 2019-05-25 MED ORDER — TEMAZEPAM 15 MG PO CAPS: 15.0000 mg | ORAL_CAPSULE | Freq: Every evening | ORAL | 5 refills | Status: DC | PRN

## 2019-05-25 MED ORDER — CARVEDILOL PHOSPHATE ER 20 MG PO CP24: 20.0000 mg | ORAL_CAPSULE | Freq: Every day | ORAL | 3 refills | Status: DC

## 2019-05-26 ENCOUNTER — Encounter: Payer: Self-pay | Admitting: Physician Assistant

## 2019-05-26 LAB — LIPID PANEL
Chol/HDL Ratio: 4.7 ratio (ref 0.0–5.0)
Cholesterol, Total: 154 mg/dL (ref 100–199)
HDL: 33 mg/dL — ABNORMAL LOW (ref >39)
LDL Chol Calc (NIH): 80 mg/dL (ref 0–99)
Triglycerides: 250 mg/dL — ABNORMAL HIGH (ref 0–149)
VLDL Cholesterol Cal: 41 mg/dL — ABNORMAL HIGH (ref 5–40)

## 2019-05-26 LAB — CMP14+EGFR
ALT: 64 IU/L — ABNORMAL HIGH (ref 0–44)
AST: 39 IU/L (ref 0–40)
Albumin/Globulin Ratio: 1.8 (ref 1.2–2.2)
Albumin: 4.6 g/dL (ref 4.0–5.0)
Alkaline Phosphatase: 62 IU/L (ref 39–117)
BUN/Creatinine Ratio: 11 (ref 9–20)
BUN: 10 mg/dL (ref 6–20)
Bilirubin Total: 0.5 mg/dL (ref 0.0–1.2)
CO2: 25 mmol/L (ref 20–29)
Calcium: 9.6 mg/dL (ref 8.7–10.2)
Chloride: 101 mmol/L (ref 96–106)
Creatinine, Ser: 0.95 mg/dL (ref 0.76–1.27)
GFR calc Af Amer: 123 mL/min/{1.73_m2} (ref >59)
GFR calc non Af Amer: 106 mL/min/{1.73_m2} (ref >59)
Globulin, Total: 2.6 g/dL (ref 1.5–4.5)
Glucose: 90 mg/dL (ref 65–99)
Potassium: 4.4 mmol/L (ref 3.5–5.2)
Sodium: 138 mmol/L (ref 134–144)
Total Protein: 7.2 g/dL (ref 6.0–8.5)

## 2019-05-26 NOTE — Progress Notes (Addendum)
BP 139/85   Pulse 71   Temp (!) 97.3 F (36.3 C) (Temporal)   Ht '5\' 11"'$  (1.803 m)   Wt 272 lb 12.8 oz (123.7 kg)   SpO2 99%   BMI 38.05 kg/m    Subjective:    Patient ID: Eugene Love, male    DOB: 1986-02-27, 33 y.o.   MRN: [id number]  HPI: Eugene Love is a 33 y.o. male presenting on 05/25/2019 for Hypertension (3 month )  This patient comes in for.  Recheck on his chronic medical conditions which do include hypertension, hyperlipidemia, insomnia.  He does need a refill on his medication and lab work will be performed today.  He did eat a little bit at lunch.  We will keep that in mind we see the result.  He does not feel that the clonazepam is helping anymore.  He only takes it at sleep.  He does not feel is working as much.  We have talked about the fact he has taken it for a while now so we will discontinue it and then try Restoril and see if this will help him with sleep.  The remainder of his conditions are doing well.  Past Medical History:  Diagnosis Date  . Cellulitis    inner thigh   . History of kidney stones   . History of right flank pain   . Hypertension    Relevant past medical, surgical, family and social history reviewed and updated as indicated. Interim medical history since our last visit reviewed. Allergies and medications reviewed and updated. DATA REVIEWED: CHART IN EPIC  Family History reviewed for pertinent findings.  Review of Systems  Constitutional: Negative.  Negative for appetite change and fatigue.  Eyes: Negative for pain and visual disturbance.  Respiratory: Negative.  Negative for cough, chest tightness, shortness of breath and wheezing.   Cardiovascular: Negative.  Negative for chest pain, palpitations and leg swelling.  Gastrointestinal: Negative.  Negative for abdominal pain, diarrhea, nausea and vomiting.  Genitourinary: Negative.   Skin: Negative.  Negative for color change and rash.  Neurological: Negative.  Negative for weakness,  numbness and headaches.  Psychiatric/Behavioral: Negative.     Allergies as of 05/25/2019      Reactions   Lisinopril    cough   Norvasc [amlodipine Besylate]    edema      Medication List       Accurate as of May 25, 2019 11:59 PM. If you have any questions, ask your nurse or doctor.        APPLE CIDER VINEGAR PO Take by mouth.   atorvastatin 20 MG tablet Commonly known as: LIPITOR Take 1 tablet (20 mg total) by mouth daily.   carvedilol 20 MG 24 hr capsule Commonly known as: COREG CR Take 1 capsule (20 mg total) by mouth daily.   clonazePAM 0.5 MG tablet Commonly known as: KLONOPIN TAKE ONE TABLET DAILY AT BEDTIME   FISH OIL PO Take 1 capsule by mouth daily.   multivitamin with minerals tablet Take 1 tablet by mouth daily.   saw palmetto 500 MG capsule Take 500 mg by mouth daily.   spironolactone 25 MG tablet Commonly known as: ALDACTONE Take 1 tablet (25 mg total) by mouth daily.   temazepam 15 MG capsule Commonly known as: RESTORIL Take 1 capsule (15 mg total) by mouth at bedtime as needed for sleep. Started by: Terald Sleeper, PA-C   valACYclovir 1000 MG tablet Commonly known as: VALTREX Take 1 tablet (  1,000 mg total) by mouth daily.          Objective:    BP 139/85   Pulse 71   Temp (!) 97.3 F (36.3 C) (Temporal)   Ht '5\' 11"'$  (1.803 m)   Wt 272 lb 12.8 oz (123.7 kg)   SpO2 99%   BMI 38.05 kg/m   Allergies  Allergen Reactions  . Lisinopril     cough  . Norvasc [Amlodipine Besylate]     edema    Wt Readings from Last 3 Encounters:  05/25/19 272 lb 12.8 oz (123.7 kg)  02/19/19 262 lb 9.6 oz (119.1 kg)  08/17/18 283 lb (128.4 kg)    Physical Exam Vitals signs and nursing note reviewed.  Constitutional:      General: He is not in acute distress.    Appearance: He is well-developed.  HENT:     Head: Normocephalic and atraumatic.  Eyes:     Conjunctiva/sclera: Conjunctivae normal.     Pupils: Pupils are equal, round, and  reactive to light.  Cardiovascular:     Rate and Rhythm: Normal rate and regular rhythm.     Heart sounds: Normal heart sounds.  Pulmonary:     Effort: Pulmonary effort is normal. No respiratory distress.     Breath sounds: Normal breath sounds.  Skin:    General: Skin is warm and dry.  Psychiatric:        Behavior: Behavior normal.     Results for orders placed or performed in visit on 05/25/19  CMP14+EGFR  Result Value Ref Range   Glucose 90 65 - 99 mg/dL   BUN 10 6 - 20 mg/dL   Creatinine, Ser 0.95 0.76 - 1.27 mg/dL   GFR calc non Af Amer 106 >59 mL/min/1.73   GFR calc Af Amer 123 >59 mL/min/1.73   BUN/Creatinine Ratio 11 9 - 20   Sodium 138 134 - 144 mmol/L   Potassium 4.4 3.5 - 5.2 mmol/L   Chloride 101 96 - 106 mmol/L   CO2 25 20 - 29 mmol/L   Calcium 9.6 8.7 - 10.2 mg/dL   Total Protein 7.2 6.0 - 8.5 g/dL   Albumin 4.6 4.0 - 5.0 g/dL   Globulin, Total 2.6 1.5 - 4.5 g/dL   Albumin/Globulin Ratio 1.8 1.2 - 2.2   Bilirubin Total 0.5 0.0 - 1.2 mg/dL   Alkaline Phosphatase 62 39 - 117 IU/L   AST 39 0 - 40 IU/L   ALT 64 (H) 0 - 44 IU/L  Lipid panel  Result Value Ref Range   Cholesterol, Total 154 100 - 199 mg/dL   Triglycerides 250 (H) 0 - 149 mg/dL   HDL 33 (L) >39 mg/dL   VLDL Cholesterol Cal 41 (H) 5 - 40 mg/dL   LDL Chol Calc (NIH) 80 0 - 99 mg/dL   Chol/HDL Ratio 4.7 0.0 - 5.0 ratio      Assessment & Plan:   1. Essential hypertension - carvedilol (COREG CR) 20 MG 24 hr capsule; Take 1 capsule (20 mg total) by mouth daily.  Dispense: 90 capsule; Refill: 3 - CMP14+EGFR - Lipid panel  2. Need for immunization against influenza - Flu Vaccine QUAD 36+ mos IM  3. Primary insomnia - temazepam (RESTORIL) 15 MG capsule; Take 1 capsule (15 mg total) by mouth at bedtime as needed for sleep.  Dispense: 30 capsule; Refill: 5   Continue all other maintenance medications as listed above.  Follow up plan: Return in about 3 months (around  08/24/2019).   Educational handout given for [place] PA-C [facility] [address]  [place], [place] [address] [phone]   05/26/2019, 12:24 PM

## 2019-07-02 ENCOUNTER — Other Ambulatory Visit: Payer: Self-pay

## 2019-07-02 DIAGNOSIS — Z20822 Contact with and (suspected) exposure to covid-19: Secondary | ICD-10-CM

## 2019-07-03 LAB — NOVEL CORONAVIRUS, NAA: SARS-CoV-2, NAA: NOT DETECTED

## 2019-08-02 ENCOUNTER — Encounter: Payer: Self-pay | Admitting: Family Medicine

## 2019-08-02 ENCOUNTER — Ambulatory Visit (INDEPENDENT_AMBULATORY_CARE_PROVIDER_SITE_OTHER): Payer: BC Managed Care – PPO | Admitting: Family Medicine

## 2019-08-02 DIAGNOSIS — J069 Acute upper respiratory infection, unspecified: Secondary | ICD-10-CM | POA: Diagnosis not present

## 2019-08-02 MED ORDER — PREDNISONE 20 MG PO TABS: ORAL_TABLET | ORAL | 0 refills | Status: DC

## 2019-08-02 MED ORDER — ALBUTEROL SULFATE HFA 108 (90 BASE) MCG/ACT IN AERS: 2.0000 | INHALATION_SPRAY | Freq: Four times a day (QID) | RESPIRATORY_TRACT | 0 refills | Status: DC | PRN

## 2019-08-02 MED ORDER — AMOXICILLIN-POT CLAVULANATE 875-125 MG PO TABS: 1.0000 | ORAL_TABLET | Freq: Two times a day (BID) | ORAL | 0 refills | Status: AC

## 2019-08-02 NOTE — Progress Notes (Signed)
Virtual Visit via telephone Note Due to COVID-19 pandemic this visit was conducted virtually. This visit type was conducted due to national recommendations for restrictions regarding the COVID-19 Pandemic (e.g. social distancing, sheltering in place) in an effort to limit this patient's exposure and mitigate transmission in our community. All issues noted in this document were discussed and addressed.  A physical exam was not performed with this format.   I connected with [name] on 08/02/2019 at 26 by telephone and verified that I am speaking with the correct person using two identifiers. Demontrez Rindfleisch is currently located at work and no one is currently with them during visit. The provider, Monia Pouch, FNP is located in their office at time of visit.  I discussed the limitations, risks, security and privacy concerns of performing an evaluation and management service by telephone and the availability of in person appointments. I also discussed with the patient that there may be a patient responsible charge related to this service. The patient expressed understanding and agreed to proceed.  Subjective:  Patient ID: Eugene Love, male    DOB: 06/13/1986, 33 y.o.   MRN: [id number]  Chief Complaint:  URI   HPI: Jarome Trull is a 33 y.o. male presenting on 08/02/2019 for URI   URI  This is a new problem. The current episode started 1 to 4 weeks ago. The problem has been gradually worsening. There has been no fever. Associated symptoms include congestion, coughing and wheezing. Pertinent negatives include no abdominal pain, chest pain, diarrhea, dysuria, ear pain, headaches, joint pain, joint swelling, nausea, neck pain, plugged ear sensation, rash, rhinorrhea, sinus pain, sneezing, sore throat, swollen glands or vomiting. He has tried acetaminophen and decongestant for the symptoms. The treatment provided no relief.     Relevant past medical, surgical, family, and social history reviewed  and updated as indicated.  Allergies and medications reviewed and updated.   Past Medical History:  Diagnosis Date  . Cellulitis    inner thigh   . History of kidney stones   . History of right flank pain   . Hypertension     Past Surgical History:  Procedure Laterality Date  . CYSTOSCOPY WITH RETROGRADE PYELOGRAM, URETEROSCOPY AND STENT PLACEMENT Right 01/06/2017   Procedure: CYSTOSCOPY WITH RIGHT RETROGRADE PYELOGRAM, URETEROSCOPY WITH STONE REMOVAL;  Surgeon: Raynelle Bring, MD;  Location: [facility];  Service: Urology;  Laterality: Right;  . HOLMIUM LASER APPLICATION Right 5/32/9924   Procedure: HOLMIUM LASER APPLICATION;  Surgeon: Raynelle Bring, MD;  Location: [facility];  Service: Urology;  Laterality: Right;  . KNEE ARTHROTOMY Right   . URETHRAL DILATION      Social History   Socioeconomic History  . Marital status: Married    Spouse name: Not on file  . Number of children: Not on file  . Years of education: Not on file  . Highest education level: Not on file  Occupational History  . Not on file  Social Needs  . Financial resource strain: Not on file  . Food insecurity    Worry: Not on file    Inability: Not on file  . Transportation needs    Medical: Not on file    Non-medical: Not on file  Tobacco Use  . Smoking status: Never Smoker  . Smokeless tobacco: Current User    Types: Snuff  . Tobacco comment: chews tobacco  Substance and Sexual Activity  . Alcohol use: Yes    Comment: 6 beers over the last month   .  Drug use: No  . Sexual activity: Not on file  Lifestyle  . Physical activity    Days per week: Not on file    Minutes per session: Not on file  . Stress: Not on file  Relationships  . Social Musicianconnections    Talks on phone: Not on file    Gets together: Not on file    Attends religious service: Not on file    Active member of club or organization: Not on file    Attends meetings of clubs or organizations: Not on file    Relationship status: Not on  file  . Intimate partner violence    Fear of current or ex partner: Not on file    Emotionally abused: Not on file    Physically abused: Not on file    Forced sexual activity: Not on file  Other Topics Concern  . Not on file  Social History Narrative  . Not on file    Outpatient Encounter Medications as of 08/02/2019  Medication Sig  . albuterol (VENTOLIN HFA) 108 (90 Base) MCG/ACT inhaler Inhale 2 puffs into the lungs every 6 (six) hours as needed for wheezing or shortness of breath.  Marland Kitchen. amoxicillin-clavulanate (AUGMENTIN) 875-125 MG tablet Take 1 tablet by mouth 2 (two) times daily for 7 days.  . APPLE CIDER VINEGAR PO Take by mouth.  Marland Kitchen. atorvastatin (LIPITOR) 20 MG tablet Take 1 tablet (20 mg total) by mouth daily.  . carvedilol (COREG CR) 20 MG 24 hr capsule Take 1 capsule (20 mg total) by mouth daily.  . clonazePAM (KLONOPIN) 0.5 MG tablet TAKE ONE TABLET DAILY AT BEDTIME  . Multiple Vitamins-Minerals (MULTIVITAMIN WITH MINERALS) tablet Take 1 tablet by mouth daily.  . Omega-3 Fatty Acids (FISH OIL PO) Take 1 capsule by mouth daily.  . predniSONE (DELTASONE) 20 MG tablet 2 po at sametime daily for 5 days  . saw palmetto 500 MG capsule Take 500 mg by mouth daily.  Marland Kitchen. spironolactone (ALDACTONE) 25 MG tablet Take 1 tablet (25 mg total) by mouth daily.  . temazepam (RESTORIL) 15 MG capsule Take 1 capsule (15 mg total) by mouth at bedtime as needed for sleep.  . valACYclovir (VALTREX) 1000 MG tablet Take 1 tablet (1,000 mg total) by mouth daily.   No facility-administered encounter medications on file as of 08/02/2019.     Allergies  Allergen Reactions  . Lisinopril     cough  . Norvasc [Amlodipine Besylate]     edema    Review of Systems  Constitutional: Negative for activity change, appetite change, chills, diaphoresis, fatigue, fever and unexpected weight change.  HENT: Positive for congestion and voice change. Negative for ear pain, postnasal drip, rhinorrhea, sinus pressure,  sinus pain, sneezing, sore throat, tinnitus and trouble swallowing.   Eyes: Negative.  Negative for photophobia and visual disturbance.  Respiratory: Positive for cough and wheezing. Negative for choking, chest tightness and stridor.   Cardiovascular: Negative for chest pain, palpitations and leg swelling.  Gastrointestinal: Negative for abdominal pain, blood in stool, constipation, diarrhea, nausea and vomiting.  Endocrine: Negative.   Genitourinary: Negative for decreased urine volume, difficulty urinating, dysuria, frequency and urgency.  Musculoskeletal: Negative for arthralgias, joint pain, myalgias and neck pain.  Skin: Negative.  Negative for rash.  Allergic/Immunologic: Negative.   Neurological: Negative for dizziness, tremors, seizures, syncope, facial asymmetry, speech difficulty, weakness, light-headedness, numbness and headaches.  Hematological: Negative.   Psychiatric/Behavioral: Negative for confusion, hallucinations, sleep disturbance and suicidal ideas.  All other  systems reviewed and are negative.        Observations/Objective: No vital signs or physical exam, this was a telephone or virtual health encounter.  Pt alert and oriented, answers all questions appropriately, and able to speak in full sentences.    Assessment and Plan: Roshun was seen today for uri.  Diagnoses and all orders for this visit:  URI with cough and congestion Ongoing and worsening cough over the last 12 days. Family has tested negative for COVID-19 and their symptoms have resolved. Pt continues to have cough, chest congestion, wheezing, and laryngitis. Will initiate below due to ongoing symptoms. Medications as prescribed. Report any new, worsening, or persistent symptoms. Follow up as needed.  -     predniSONE (DELTASONE) 20 MG tablet; 2 po at sametime daily for 5 days -     amoxicillin-clavulanate (AUGMENTIN) 875-125 MG tablet; Take 1 tablet by mouth 2 (two) times daily for 7 days. -      albuterol (VENTOLIN HFA) 108 (90 Base) MCG/ACT inhaler; Inhale 2 puffs into the lungs every 6 (six) hours as needed for wheezing or shortness of breath.     Follow Up Instructions: Return if symptoms worsen or fail to improve.    I discussed the assessment and treatment plan with the patient. The patient was provided an opportunity to ask questions and all were answered. The patient agreed with the plan and demonstrated an understanding of the instructions.   The patient was advised to call back or seek an in-person evaluation if the symptoms worsen or if the condition fails to improve as anticipated.  The above assessment and management plan was discussed with the patient. The patient verbalized understanding of and has agreed to the management plan. Patient is aware to call the clinic if they develop any new symptoms or if symptoms persist or worsen. Patient is aware when to return to the clinic for a follow-up visit. Patient educated on when it is appropriate to go to the emergency department.    I provided 15 minutes of non-face-to-face time during this encounter. The call started at 1320. The call ended at 1335. The other time was used for coordination of care.    Kari Baars, FNP-C [facility] [address] [place], [place] [address] [phone] 08/02/2019

## 2019-08-05 ENCOUNTER — Other Ambulatory Visit: Payer: Self-pay | Admitting: Family Medicine

## 2019-08-05 ENCOUNTER — Encounter: Payer: Self-pay | Admitting: Physician Assistant

## 2019-08-05 DIAGNOSIS — R05 Cough: Secondary | ICD-10-CM

## 2019-08-05 DIAGNOSIS — R059 Cough, unspecified: Secondary | ICD-10-CM

## 2019-08-05 MED ORDER — BENZONATATE 100 MG PO CAPS: 100.0000 mg | ORAL_CAPSULE | Freq: Three times a day (TID) | ORAL | 0 refills | Status: DC | PRN

## 2019-08-18 ENCOUNTER — Encounter: Payer: Self-pay | Admitting: Physician Assistant

## 2019-08-18 ENCOUNTER — Ambulatory Visit (INDEPENDENT_AMBULATORY_CARE_PROVIDER_SITE_OTHER): Payer: BC Managed Care – PPO | Admitting: Physician Assistant

## 2019-08-18 DIAGNOSIS — J069 Acute upper respiratory infection, unspecified: Secondary | ICD-10-CM

## 2019-08-18 DIAGNOSIS — R059 Cough, unspecified: Secondary | ICD-10-CM

## 2019-08-18 DIAGNOSIS — R05 Cough: Secondary | ICD-10-CM | POA: Diagnosis not present

## 2019-08-18 MED ORDER — ALBUTEROL SULFATE HFA 108 (90 BASE) MCG/ACT IN AERS: 2.0000 | INHALATION_SPRAY | Freq: Four times a day (QID) | RESPIRATORY_TRACT | 1 refills | Status: DC | PRN

## 2019-08-18 MED ORDER — FLUTICASONE-SALMETEROL 250-50 MCG/DOSE IN AEPB: 1.0000 | INHALATION_SPRAY | Freq: Two times a day (BID) | RESPIRATORY_TRACT | 5 refills | Status: DC

## 2019-08-18 NOTE — Patient Instructions (Signed)
Asthma, Adult ° °Asthma is a long-term (chronic) condition in which the airways get tight and narrow. The airways are the breathing passages that lead from the nose and mouth down into the lungs. A person with asthma will have times when symptoms get worse. These are called asthma attacks. They can cause coughing, whistling sounds when you breathe (wheezing), shortness of breath, and chest pain. They can make it hard to breathe. There is no cure for asthma, but medicines and lifestyle changes can help control it. °There are many things that can bring on an asthma attack or make asthma symptoms worse (triggers). Common triggers include: °· Mold. °· Dust. °· Cigarette smoke. °· Cockroaches. °· Things that can cause allergy symptoms (allergens). These include animal skin flakes (dander) and pollen from trees or grass. °· Things that pollute the air. These may include household cleaners, wood smoke, smog, or chemical odors. °· Cold air, weather changes, and wind. °· Crying or laughing hard. °· Stress. °· Certain medicines or drugs. °· Certain foods such as dried fruit, potato chips, and grape juice. °· Infections, such as a cold or the flu. °· Certain medical conditions or diseases. °· Exercise or tiring activities. °Asthma may be treated with medicines and by staying away from the things that cause asthma attacks. Types of medicines may include: °· Controller medicines. These help prevent asthma symptoms. They are usually taken every day. °· Fast-acting reliever or rescue medicines. These quickly relieve asthma symptoms. They are used as needed and provide short-term relief. °· Allergy medicines if your attacks are brought on by allergens. °· Medicines to help control the body's defense (immune) system. °Follow these instructions at home: °Avoiding triggers in your home °· Change your heating and air conditioning filter often. °· Limit your use of fireplaces and wood stoves. °· Get rid of pests (such as roaches and  mice) and their droppings. °· Throw away plants if you see mold on them. °· Clean your floors. Dust regularly. Use cleaning products that do not smell. °· Have someone vacuum when you are not home. Use a vacuum cleaner with a HEPA filter if possible. °· Replace carpet with wood, tile, or vinyl flooring. Carpet can trap animal skin flakes and dust. °· Use allergy-proof pillows, mattress covers, and box spring covers. °· Wash bed sheets and blankets every week in hot water. Dry them in a dryer. °· Keep your bedroom free of any triggers. °· Avoid pets and keep windows closed when things that cause allergy symptoms are in the air. °· Use blankets that are made of polyester or cotton. °· Clean bathrooms and kitchens with bleach. If possible, have someone repaint the walls in these rooms with mold-resistant paint. Keep out of the rooms that are being cleaned and painted. °· Wash your hands often with soap and water. If soap and water are not available, use hand sanitizer. °· Do not allow anyone to smoke in your home. °General instructions °· Take over-the-counter and prescription medicines only as told by your doctor. °? Talk with your doctor if you have questions about how or when to take your medicines. °? Make note if you need to use your medicines more often than usual. °· Do not use any products that contain nicotine or tobacco, such as cigarettes and e-cigarettes. If you need help quitting, ask your doctor. °· Stay away from secondhand smoke. °· Avoid doing things outdoors when allergen counts are high and when air quality is low. °· Wear a ski mask   when doing outdoor activities in the winter. The mask should cover your nose and mouth. Exercise indoors on cold days if you can. °· Warm up before you exercise. Take time to cool down after exercise. °· Use a peak flow meter as told by your doctor. A peak flow meter is a tool that measures how well the lungs are working. °· Keep track of the peak flow meter's readings.  Write them down. °· Follow your asthma action plan. This is a written plan for taking care of your asthma and treating your attacks. °· Make sure you get all the shots (vaccines) that your doctor recommends. Ask your doctor about a flu shot and a pneumonia shot. °· Keep all follow-up visits as told by your doctor. This is important. °Contact a doctor if: °· You have wheezing, shortness of breath, or a cough even while taking medicine to prevent attacks. °· The mucus you cough up (sputum) is thicker than usual. °· The mucus you cough up changes from clear or white to yellow, green, gray, or bloody. °· You have problems from the medicine you are taking, such as: °? A rash. °? Itching. °? Swelling. °? Trouble breathing. °· You need reliever medicines more than 2-3 times a week. °· Your peak flow reading is still at 50-79% of your personal best after following the action plan for 1 hour. °· You have a fever. °Get help right away if: °· You seem to be worse and are not responding to medicine during an asthma attack. °· You are short of breath even at rest. °· You get short of breath when doing very little activity. °· You have trouble eating, drinking, or talking. °· You have chest pain or tightness. °· You have a fast heartbeat. °· Your lips or fingernails start to turn blue. °· You are light-headed or dizzy, or you faint. °· Your peak flow is less than 50% of your personal best. °· You feel too tired to breathe normally. °Summary °· Asthma is a long-term (chronic) condition in which the airways get tight and narrow. An asthma attack can make it hard to breathe. °· Asthma cannot be cured, but medicines and lifestyle changes can help control it. °· Make sure you understand how to avoid triggers and how and when to use your medicines. °This information is not intended to replace advice given to you by your health care provider. Make sure you discuss any questions you have with your health care provider. °Document  Released: 01/29/2008 Document Revised: 10/15/2018 Document Reviewed: 09/16/2016 °Elsevier Patient Education © 2020 [employer] ° °

## 2019-08-18 NOTE — Progress Notes (Signed)
Telephone visit  Subjective: [id number] PCP: Remus Loffler, PA-C Eugene Love is a 33 y.o. male calls for telephone consult today. Patient provides verbal consent for consult held via phone.  Patient is identified with 2 separate identifiers.  At this time the entire area is on COVID-19 social distancing and stay home orders are in place.  Patient is of higher risk and therefore we are performing this by a virtual method.  Location of patient: home Location of provider: [facility] Others present for call: wife  This patient has been dealing with a upper respiratory cough and cold for over a month now.  He was seen on 08/09/2019 and his Covid test was negative.  However he does continue with significant cough.  He goes throughout the day.  He denies any fever or chills.  It is not productive at this time.  At times he will cough so hard that it makes him feel like he cannot lose his breath.  He will occasionally wake up at night with with cough.   ROS: Per HPI  Allergies  Allergen Reactions  . Lisinopril     cough  . Norvasc [Amlodipine Besylate]     edema   Past Medical History:  Diagnosis Date  . Cellulitis    inner thigh   . History of kidney stones   . History of right flank pain   . Hypertension     Current Outpatient Medications:  .  albuterol (VENTOLIN HFA) 108 (90 Base) MCG/ACT inhaler, Inhale 2 puffs into the lungs every 6 (six) hours as needed for wheezing or shortness of breath., Disp: 18 g, Rfl: 1 .  APPLE CIDER VINEGAR PO, Take by mouth., Disp: , Rfl:  .  atorvastatin (LIPITOR) 20 MG tablet, Take 1 tablet (20 mg total) by mouth daily., Disp: 90 tablet, Rfl: 3 .  benzonatate (TESSALON PERLES) 100 MG capsule, Take 1 capsule (100 mg total) by mouth 3 (three) times daily as needed for cough., Disp: 20 capsule, Rfl: 0 .  carvedilol (COREG CR) 20 MG 24 hr capsule, Take 1 capsule (20 mg total) by mouth daily., Disp: 90 capsule, Rfl: 3 .  clonazePAM (KLONOPIN) 0.5  MG tablet, TAKE ONE TABLET DAILY AT BEDTIME, Disp: 60 tablet, Rfl: 2 .  Fluticasone-Salmeterol (ADVAIR DISKUS) 250-50 MCG/DOSE AEPB, Inhale 1 puff into the lungs 2 (two) times daily., Disp: 1 each, Rfl: 5 .  Multiple Vitamins-Minerals (MULTIVITAMIN WITH MINERALS) tablet, Take 1 tablet by mouth daily., Disp: , Rfl:  .  Omega-3 Fatty Acids (FISH OIL PO), Take 1 capsule by mouth daily., Disp: , Rfl:  .  predniSONE (DELTASONE) 20 MG tablet, 2 po at sametime daily for 5 days, Disp: 10 tablet, Rfl: 0 .  saw palmetto 500 MG capsule, Take 500 mg by mouth daily., Disp: , Rfl:  .  spironolactone (ALDACTONE) 25 MG tablet, Take 1 tablet (25 mg total) by mouth daily., Disp: 90 tablet, Rfl: 3 .  temazepam (RESTORIL) 15 MG capsule, Take 1 capsule (15 mg total) by mouth at bedtime as needed for sleep., Disp: 30 capsule, Rfl: 5 .  valACYclovir (VALTREX) 1000 MG tablet, Take 1 tablet (1,000 mg total) by mouth daily., Disp: 90 tablet, Rfl: 3  Assessment/ Plan: 33 y.o. male   1. Cough - Fluticasone-Salmeterol (ADVAIR DISKUS) 250-50 MCG/DOSE AEPB; Inhale 1 puff into the lungs 2 (two) times daily.  Dispense: 1 each; Refill: 5 - albuterol (VENTOLIN HFA) 108 (90 Base) MCG/ACT inhaler; Inhale 2 puffs into  the lungs every 6 (six) hours as needed for wheezing or shortness of breath.  Dispense: 18 g; Refill: 1  2. URI with cough and congestion - albuterol (VENTOLIN HFA) 108 (90 Base) MCG/ACT inhaler; Inhale 2 puffs into the lungs every 6 (six) hours as needed for wheezing or shortness of breath.  Dispense: 18 g; Refill: 1   No follow-ups on file.  Continue all other maintenance medications as listed above.  Start time: 10:25 AM End time: 10:35 AM  Meds ordered this encounter  Medications  . Fluticasone-Salmeterol (ADVAIR DISKUS) 250-50 MCG/DOSE AEPB    Sig: Inhale 1 puff into the lungs 2 (two) times daily.    Dispense:  1 each    Refill:  5    Order Specific Question:   Supervising Provider    Answer:    Janora Norlander [[id number]]  . albuterol (VENTOLIN HFA) 108 (90 Base) MCG/ACT inhaler    Sig: Inhale 2 puffs into the lungs every 6 (six) hours as needed for wheezing or shortness of breath.    Dispense:  18 g    Refill:  1    Hold until patient calls for refill    Order Specific Question:   Supervising Provider    Answer:   Janora Norlander [[id number]]    Particia Nearing PA-C [place] [phone]

## 2019-10-31 ENCOUNTER — Ambulatory Visit: Payer: BC Managed Care – PPO | Attending: Internal Medicine

## 2019-10-31 DIAGNOSIS — Z23 Encounter for immunization: Secondary | ICD-10-CM

## 2019-10-31 NOTE — Progress Notes (Signed)
   Covid-19 Vaccination Clinic  Name:  Eugene Love    MRN: [id number] DOB: [birth year]  10/31/2019  Mr. Seal was observed post Covid-19 immunization for 15 minutes without incident. He was provided with Vaccine Information Sheet and instruction to access the V-Safe system.   [name] was instructed to call 911 with any severe reactions post vaccine: Marland Kitchen Difficulty breathing  . Swelling of face and throat  . A fast heartbeat  . A bad rash all over body  . Dizziness and weakness   Immunizations Administered    Name Date Dose VIS Date Route   Pfizer COVID-19 Vaccine 10/31/2019  5:21 PM 0.3 mL 08/06/2019 Intramuscular   Manufacturer: [employer], [employer]   Lot: [id number]   NDC: [address]-6

## 2019-11-21 ENCOUNTER — Ambulatory Visit: Payer: BC Managed Care – PPO | Attending: Internal Medicine

## 2019-11-21 ENCOUNTER — Other Ambulatory Visit: Payer: Self-pay

## 2019-11-21 DIAGNOSIS — Z23 Encounter for immunization: Secondary | ICD-10-CM

## 2019-11-21 NOTE — Progress Notes (Signed)
   Covid-19 Vaccination Clinic  Name:  Eugene Love    MRN: [id number] DOB: 02/28/1986  11/21/2019  Eugene Love was observed post Covid-19 immunization for 15 minutes without incident. He was provided with Vaccine Information Sheet and instruction to access the V-Safe system.   Eugene Love was instructed to call 911 with any severe reactions post vaccine: Marland Kitchen Difficulty breathing  . Swelling of face and throat  . A fast heartbeat  . A bad rash all over body  . Dizziness and weakness   Immunizations Administered    Name Date Dose VIS Date Route   Pfizer COVID-19 Vaccine 11/21/2019  2:04 PM 0.3 mL 08/06/2019 Intramuscular   Manufacturer: [employer], [employer]   Lot: [id number]   NDC: [address]-0

## 2019-11-29 ENCOUNTER — Telehealth: Payer: Self-pay | Admitting: Family Medicine

## 2019-11-29 ENCOUNTER — Encounter: Payer: Self-pay | Admitting: Family Medicine

## 2019-11-29 ENCOUNTER — Other Ambulatory Visit: Payer: Self-pay

## 2019-11-29 ENCOUNTER — Ambulatory Visit (INDEPENDENT_AMBULATORY_CARE_PROVIDER_SITE_OTHER): Payer: BC Managed Care – PPO | Admitting: Family Medicine

## 2019-11-29 ENCOUNTER — Ambulatory Visit: Payer: Self-pay | Admitting: Physician Assistant

## 2019-11-29 DIAGNOSIS — F5101 Primary insomnia: Secondary | ICD-10-CM | POA: Diagnosis not present

## 2019-11-29 DIAGNOSIS — R945 Abnormal results of liver function studies: Secondary | ICD-10-CM

## 2019-11-29 DIAGNOSIS — I10 Essential (primary) hypertension: Secondary | ICD-10-CM

## 2019-11-29 DIAGNOSIS — Z130 Encounter for screening for diseases of the blood and blood-forming organs and certain disorders involving the immune mechanism: Secondary | ICD-10-CM

## 2019-11-29 MED ORDER — DOXEPIN HCL 10 MG PO CAPS: 10.0000 mg | ORAL_CAPSULE | Freq: Every evening | ORAL | 1 refills | Status: DC | PRN

## 2019-11-29 NOTE — Telephone Encounter (Signed)
Please advise on future lab orders. 

## 2019-11-29 NOTE — Progress Notes (Signed)
BP 138/87   Pulse 93   Temp 98 F (36.7 C)   Ht 5\' 11"  (1.803 m)   Wt 281 lb (127.5 kg)   SpO2 98%   BMI 39.19 kg/m    Subjective:   Patient ID: Eugene Love, male    DOB: 07-28-1986, 34 y.o.   MRN: [id number]  HPI: Eugene Love is a 34 y.o. 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  He definitely needs something to help with sleep.  Relevant past medical, surgical, family and social history reviewed and updated as indicated. Interim medical history since our last visit reviewed. Allergies and medications reviewed and updated.  Review of Systems  Constitutional: Negative for chills and fever.  Respiratory: Negative for shortness of breath and wheezing.   Cardiovascular: Negative for chest pain and leg swelling.  Musculoskeletal: Negative for back pain and gait problem.  Skin: Negative for rash.  Psychiatric/Behavioral: Positive for dysphoric mood and sleep disturbance. Negative for self-injury and suicidal ideas. The patient is nervous/anxious.   All other systems reviewed and are negative.   Per HPI unless specifically indicated above   Allergies as of 11/29/2019      Reactions   Lisinopril    cough   Norvasc [amlodipine Besylate]    edema      Medication List       Accurate as of November 29, 2019  3:59 PM. If you have any questions, ask your nurse or doctor.        STOP taking these medications   benzonatate 100 MG capsule Commonly known as: Best boy Stopped by: Worthy Rancher, MD     clonazePAM 0.5 MG tablet Commonly known as: KLONOPIN Stopped by: Fransisca Kaufmann Elfida Shimada, MD   predniSONE 20 MG tablet Commonly known as: Deltasone Stopped by: Worthy Rancher, MD     TAKE these medications   albuterol 108 (90 Base) MCG/ACT inhaler Commonly known as: VENTOLIN HFA Inhale 2 puffs into the lungs every 6 (six) hours as needed for wheezing or shortness of breath.   APPLE CIDER VINEGAR PO Take by mouth.   atorvastatin 20 MG tablet Commonly known as: LIPITOR Take 1 tablet (20 mg total) by mouth daily.   carvedilol 20 MG 24 hr capsule Commonly known as: COREG CR Take 1 capsule (20 mg total) by mouth daily.   doxepin 10 MG capsule Commonly known as: SINEQUAN Take 1 capsule (10 mg total) by mouth at bedtime as needed. Started by: Fransisca Kaufmann Tamika Nou, MD   FISH OIL PO Take 1 capsule by mouth daily.   Fluticasone-Salmeterol 250-50 MCG/DOSE Aepb Commonly known as: Advair Diskus Inhale 1 puff into the lungs 2 (two) times daily.   multivitamin with minerals tablet Take 1 tablet by mouth daily.   saw palmetto 500 MG capsule Take 500 mg by mouth daily.   spironolactone 25 MG tablet Commonly known as: ALDACTONE Take 1 tablet (25 mg total) by mouth daily.   temazepam 15 MG capsule Commonly known  as: RESTORIL Take 1 capsule (15 mg total) by mouth at bedtime as needed for sleep.   valACYclovir 1000 MG tablet Commonly known as: VALTREX Take 1 tablet (1,000 mg total) by mouth daily.        Objective:   BP 138/87   Pulse 93   Temp 98 F (36.7 C)   Ht 5\' 11"  (1.803 m)   Wt 281 lb (127.5 kg)   SpO2 98%   BMI 39.19 kg/m   Wt Readings from Last 3 Encounters:  11/29/19 281 lb (127.5 kg)  05/25/19 272 lb 12.8 oz (123.7 kg)  02/19/19 262 lb 9.6 oz (119.1 kg)    Physical Exam Vitals and nursing note reviewed.  Constitutional:      General: He is not in acute distress.    Appearance: He is well-developed. He is not diaphoretic.  Eyes:     General: No  scleral icterus.    Conjunctiva/sclera: Conjunctivae normal.  Neck:     Thyroid: No thyromegaly.  Cardiovascular:     Rate and Rhythm: Normal rate and regular rhythm.     Heart sounds: Normal heart sounds. No murmur.  Pulmonary:     Effort: Pulmonary effort is normal. No respiratory distress.     Breath sounds: Normal breath sounds. No wheezing.  Musculoskeletal:        General: Normal range of motion.     Cervical back: Neck supple.  Lymphadenopathy:     Cervical: No cervical adenopathy.  Skin:    General: Skin is warm and dry.     Findings: No rash.  Neurological:     Mental Status: He is alert and oriented to person, place, and time.     Coordination: Coordination normal.  Psychiatric:        Behavior: Behavior normal.        Thought Content: Thought content does not include suicidal ideation. Thought content does not include suicidal plan.       Assessment & Plan:   Problem List Items Addressed This Visit      Other   Primary insomnia   Relevant Medications   doxepin (SINEQUAN) 10 MG capsule      Send refills for other medicines until he can get to a new PCP. Follow up plan: Return in about 4 weeks (around 12/27/2019), or if symptoms worsen or fail to improve, for Insomnia recheck and blood work with new PCP.  Counseling provided for all of the vaccine components No orders of the defined types were placed in this encounter.   02/26/2020, MD [facility] 11/29/2019, 3:59 PM

## 2019-11-30 NOTE — Telephone Encounter (Signed)
Labs ordered.

## 2019-11-30 NOTE — Telephone Encounter (Signed)
Attempted to contact patient - NVM 

## 2019-12-02 NOTE — Telephone Encounter (Signed)
Attempted to contact patient with out success.

## 2019-12-30 ENCOUNTER — Ambulatory Visit: Payer: BC Managed Care – PPO | Admitting: Family Medicine

## 2020-02-15 ENCOUNTER — Ambulatory Visit: Payer: BC Managed Care – PPO | Admitting: Family Medicine

## 2020-03-15 ENCOUNTER — Ambulatory Visit: Payer: BC Managed Care – PPO | Admitting: Family Medicine

## 2020-03-17 ENCOUNTER — Encounter: Payer: Self-pay | Admitting: Family Medicine

## 2020-05-10 ENCOUNTER — Ambulatory Visit (INDEPENDENT_AMBULATORY_CARE_PROVIDER_SITE_OTHER): Payer: BC Managed Care – PPO | Admitting: Family Medicine

## 2020-05-10 ENCOUNTER — Encounter: Payer: Self-pay | Admitting: Family Medicine

## 2020-05-10 DIAGNOSIS — U071 COVID-19: Secondary | ICD-10-CM | POA: Diagnosis not present

## 2020-05-10 MED ORDER — HYDROCODONE-HOMATROPINE 5-1.5 MG/5ML PO SYRP: 5.0000 mL | ORAL_SOLUTION | Freq: Four times a day (QID) | ORAL | 0 refills | Status: DC | PRN

## 2020-05-10 MED ORDER — DEXAMETHASONE 6 MG PO TABS: 6.0000 mg | ORAL_TABLET | Freq: Every day | ORAL | 0 refills | Status: AC

## 2020-05-10 MED ORDER — AZITHROMYCIN 250 MG PO TABS: ORAL_TABLET | ORAL | 0 refills | Status: DC

## 2020-05-10 NOTE — Progress Notes (Signed)
Virtual Visit via Telephone Note  I connected with Eugene Love on 05/10/20 at 2:24 PM by telephone and verified that I am speaking with the correct person using two identifiers. Eugene Love is currently located at home and nobody is currently with him during this visit. The provider, Gwenlyn Fudge, FNP is located in their office at time of visit.  I discussed the limitations, risks, security and privacy concerns of performing an evaluation and management service by telephone and the availability of in person appointments. I also discussed with the patient that there may be a patient responsible charge related to this service. The patient expressed understanding and agreed to proceed.  Subjective: PCP: Gwenlyn Fudge, FNP  Chief Complaint  Patient presents with  . Covid Positive   Patient complains of cough, head/chest congestion and runny nose. His coughing fits gets so bad sometimes that it makes him throw up.  Onset of symptoms was 9 days ago. He tested positive for COVID-19 with a rapid test 8 days ago and positive with a PCR test 5 days ago. He is drinking plenty of fluids. Treatment to date: inhalers. He does not smoke.  He has been vaccinated against COVID-19.   ROS: Per HPI  Current Outpatient Medications:  .  albuterol (VENTOLIN HFA) 108 (90 Base) MCG/ACT inhaler, Inhale 2 puffs into the lungs every 6 (six) hours as needed for wheezing or shortness of breath., Disp: 18 g, Rfl: 1 .  APPLE CIDER VINEGAR PO, Take by mouth., Disp: , Rfl:  .  atorvastatin (LIPITOR) 20 MG tablet, Take 1 tablet (20 mg total) by mouth daily., Disp: 90 tablet, Rfl: 3 .  carvedilol (COREG CR) 20 MG 24 hr capsule, Take 1 capsule (20 mg total) by mouth daily., Disp: 90 capsule, Rfl: 3 .  doxepin (SINEQUAN) 10 MG capsule, Take 1 capsule (10 mg total) by mouth at bedtime as needed., Disp: 30 capsule, Rfl: 1 .  Fluticasone-Salmeterol (ADVAIR DISKUS) 250-50 MCG/DOSE AEPB, Inhale 1 puff into the lungs 2  (two) times daily., Disp: 1 each, Rfl: 5 .  Multiple Vitamins-Minerals (MULTIVITAMIN WITH MINERALS) tablet, Take 1 tablet by mouth daily., Disp: , Rfl:  .  Omega-3 Fatty Acids (FISH OIL PO), Take 1 capsule by mouth daily., Disp: , Rfl:  .  saw palmetto 500 MG capsule, Take 500 mg by mouth daily., Disp: , Rfl:  .  spironolactone (ALDACTONE) 25 MG tablet, Take 1 tablet (25 mg total) by mouth daily., Disp: 90 tablet, Rfl: 3 .  valACYclovir (VALTREX) 1000 MG tablet, Take 1 tablet (1,000 mg total) by mouth daily., Disp: 90 tablet, Rfl: 3  Allergies  Allergen Reactions  . Lisinopril     cough  . Norvasc [Amlodipine Besylate]     edema   Past Medical History:  Diagnosis Date  . Cellulitis    inner thigh   . History of kidney stones   . History of right flank pain   . Hypertension     Observations/Objective: A&O  No respiratory distress or wheezing audible over the phone Mood, judgement, and thought processes all WNL  Assessment and Plan: 1. COVID-19 - Discussed symptom management. Encouraged vitamin C, vitamin D, and zinc. Advised to quarantine until 10 days have passed since the onset of symptoms and during the last 3 of those days there has been a significant improvement in respiratory symptoms and no fever without medication to mask or reduce the fever. Also discussed symptoms that should prompt patient to go straight  to the ER.  - dexamethasone (DECADRON) 6 MG tablet; Take 1 tablet (6 mg total) by mouth daily for 7 days.  Dispense: 7 tablet; Refill: 0 - azithromycin (ZITHROMAX Z-PAK) 250 MG tablet; Take 2 tablets (500 mg) PO today, then 1 tablet (250 mg) PO daily x4 days.  Dispense: 6 tablet; Refill: 0 - HYDROcodone-homatropine (HYCODAN) 5-1.5 MG/5ML syrup; Take 5 mLs by mouth every 6 (six) hours as needed for cough.  Dispense: 120 mL; Refill: 0   Follow Up Instructions:  I discussed the assessment and treatment plan with the patient. The patient was provided an opportunity to ask  questions and all were answered. The patient agreed with the plan and demonstrated an understanding of the instructions.   The patient was advised to call back or seek an in-person evaluation if the symptoms worsen or if the condition fails to improve as anticipated.  The above assessment and management plan was discussed with the patient. The patient verbalized understanding of and has agreed to the management plan. Patient is aware to call the clinic if symptoms persist or worsen. Patient is aware when to return to the clinic for a follow-up visit. Patient educated on when it is appropriate to go to the emergency department.   Time call ended: 2:37 PM  I provided 15 minutes of non-face-to-face time during this encounter.  Deliah Boston, MSN, APRN, FNP-C [facility] 05/10/20

## 2020-06-06 ENCOUNTER — Other Ambulatory Visit: Payer: Self-pay | Admitting: *Deleted

## 2020-06-06 DIAGNOSIS — I10 Essential (primary) hypertension: Secondary | ICD-10-CM

## 2020-06-06 MED ORDER — ATORVASTATIN CALCIUM 20 MG PO TABS: 20.0000 mg | ORAL_TABLET | Freq: Every day | ORAL | 0 refills | Status: DC

## 2020-06-06 MED ORDER — CARVEDILOL PHOSPHATE ER 20 MG PO CP24: 20.0000 mg | ORAL_CAPSULE | Freq: Every day | ORAL | 0 refills | Status: DC

## 2020-07-21 ENCOUNTER — Other Ambulatory Visit: Payer: Self-pay | Admitting: Family Medicine

## 2020-07-21 DIAGNOSIS — I10 Essential (primary) hypertension: Secondary | ICD-10-CM

## 2020-07-24 ENCOUNTER — Other Ambulatory Visit: Payer: Self-pay | Admitting: Family

## 2020-07-24 NOTE — Progress Notes (Signed)
Attempted to contact patient - NVM 

## 2020-07-24 NOTE — Progress Notes (Signed)
One month refill placed. Needs follow up  Visit.

## 2020-07-24 NOTE — Telephone Encounter (Signed)
Last OV for dx 04/2019, no labs since 04/2019, no follow up scheduled

## 2020-07-26 NOTE — Progress Notes (Signed)
Patient has follow up visit scheduled on 08/21/20

## 2020-08-21 ENCOUNTER — Encounter: Payer: Self-pay | Admitting: Family

## 2020-08-21 ENCOUNTER — Other Ambulatory Visit: Payer: Self-pay

## 2020-08-21 ENCOUNTER — Ambulatory Visit (INDEPENDENT_AMBULATORY_CARE_PROVIDER_SITE_OTHER): Payer: BC Managed Care – PPO | Admitting: Family

## 2020-08-21 DIAGNOSIS — F5101 Primary insomnia: Secondary | ICD-10-CM

## 2020-08-21 DIAGNOSIS — B009 Herpesviral infection, unspecified: Secondary | ICD-10-CM

## 2020-08-21 DIAGNOSIS — I10 Essential (primary) hypertension: Secondary | ICD-10-CM | POA: Diagnosis not present

## 2020-08-21 DIAGNOSIS — J069 Acute upper respiratory infection, unspecified: Secondary | ICD-10-CM

## 2020-08-21 MED ORDER — LOSARTAN POTASSIUM 50 MG PO TABS: 50.0000 mg | ORAL_TABLET | Freq: Every day | ORAL | 3 refills | Status: DC

## 2020-08-21 MED ORDER — LOSARTAN POTASSIUM 100 MG PO TABS: 100.0000 mg | ORAL_TABLET | Freq: Every day | ORAL | 3 refills | Status: DC

## 2020-08-21 MED ORDER — VALACYCLOVIR HCL 1 G PO TABS: 1000.0000 mg | ORAL_TABLET | Freq: Every day | ORAL | 3 refills | Status: DC

## 2020-08-21 NOTE — Progress Notes (Signed)
Virtual Visit via telephone Note Due to COVID-19 pandemic this visit was conducted virtually. This visit type was conducted due to national recommendations for restrictions regarding the COVID-19 Pandemic (e.g. social distancing, sheltering in place) in an effort to limit this patient's exposure and mitigate transmission in our community. All issues noted in this document were discussed and addressed.  A physical exam was not performed with this format.  I connected with Eugene Love on 08/21/20 at 11:45 AM  by telephone and verified that I am speaking with the correct person using two identifiers. Eugene Love is currently located at home and no one is currently with him  during visit. The provider, Jannifer Rodney, FNP is located in their office at time of visit.  I discussed the limitations, risks, security and privacy concerns of performing an evaluation and management service by telephone and the availability of in person appointments. I also discussed with the patient that there may be a patient responsible charge related to this service. The patient expressed understanding and agreed to proceed.   History and Present Illness:  Pt calls the office today for chronic follow up.  Hypertension This is a chronic problem. The current episode started more than 1 year ago. The problem has been waxing and waning since onset. The problem is uncontrolled. Associated symptoms include malaise/fatigue. Pertinent negatives include no headaches, peripheral edema or shortness of breath. Risk factors for coronary artery disease include obesity and male gender. Past treatments include beta blockers. The current treatment provides mild improvement. There is no history of CVA or heart failure.  Insomnia Primary symptoms: difficulty falling asleep, frequent awakening, malaise/fatigue.  The current episode started more than one year. The onset quality is gradual. The problem occurs intermittently.  Sore Throat   This is a new problem. The current episode started in the past 7 days. The problem has been waxing and waning. There has been no fever. The pain is at a severity of 3/10. The pain is mild. Associated symptoms include congestion, coughing and trouble swallowing. Pertinent negatives include no ear discharge, ear pain, headaches or shortness of breath. He has had no exposure to strep. Exposure to: has taken several COVID tests at home and have been negative . He has tried acetaminophen for the symptoms. The treatment provided mild relief.  Oral Herpes Takes Valtrex as needed. States his last flare up was two years ago.     Review of Systems  Constitutional: Positive for malaise/fatigue.  HENT: Positive for congestion and trouble swallowing. Negative for ear discharge and ear pain.   Respiratory: Positive for cough. Negative for shortness of breath.   Neurological: Negative for headaches.  Psychiatric/Behavioral: The patient has insomnia.   All other systems reviewed and are negative.    Observations/Objective: No SOB or distress noted, hoarse voice and intermittent cough  Assessment and Plan: 1. Essential hypertension Coreg is too expensive, will change to Losartan 100 mg -Dash diet information given -Exercise encouraged - Stress Management  -Continue current meds -RTO in 2 weeks  - losartan (COZAAR) 100 MG tablet; Take 1 tablet (100 mg total) by mouth daily.  Dispense: 90 tablet; Refill: 3  2. Herpes - valACYclovir (VALTREX) 1000 MG tablet; Take 1 tablet (1,000 mg total) by mouth daily.  Dispense: 90 tablet; Refill: 3  3. Primary insomnia  4. Viral URI Continue OTC  If does not improve, call and I will send in antibiotic        I discussed the assessment and treatment  plan with the patient. The patient was provided an opportunity to ask questions and all were answered. The patient agreed with the plan and demonstrated an understanding of the instructions.   The patient was  advised to call back or seek an in-person evaluation if the symptoms worsen or if the condition fails to improve as anticipated.  The above assessment and management plan was discussed with the patient. The patient verbalized understanding of and has agreed to the management plan. Patient is aware to call the clinic if symptoms persist or worsen. Patient is aware when to return to the clinic for a follow-up visit. Patient educated on when it is appropriate to go to the emergency department.   Time call ended:  12:06 pm   I provided 21 minutes of non-face-to-face time during this encounter.    Jannifer Rodney, FNP

## 2020-09-05 ENCOUNTER — Ambulatory Visit: Payer: BC Managed Care – PPO | Admitting: Family

## 2020-09-18 ENCOUNTER — Other Ambulatory Visit: Payer: Self-pay

## 2020-09-18 ENCOUNTER — Ambulatory Visit (INDEPENDENT_AMBULATORY_CARE_PROVIDER_SITE_OTHER): Payer: BC Managed Care – PPO | Admitting: Family

## 2020-09-18 ENCOUNTER — Encounter: Payer: Self-pay | Admitting: Family

## 2020-09-18 VITALS — BP 154/98 | HR 112 | Temp 97.9°F | Ht 71.0 in | Wt 289.8 lb

## 2020-09-18 DIAGNOSIS — F411 Generalized anxiety disorder: Secondary | ICD-10-CM | POA: Diagnosis not present

## 2020-09-18 DIAGNOSIS — I10 Essential (primary) hypertension: Secondary | ICD-10-CM | POA: Diagnosis not present

## 2020-09-18 DIAGNOSIS — Z114 Encounter for screening for human immunodeficiency virus [HIV]: Secondary | ICD-10-CM

## 2020-09-18 DIAGNOSIS — B009 Herpesviral infection, unspecified: Secondary | ICD-10-CM

## 2020-09-18 DIAGNOSIS — R000 Tachycardia, unspecified: Secondary | ICD-10-CM | POA: Diagnosis not present

## 2020-09-18 DIAGNOSIS — E785 Hyperlipidemia, unspecified: Secondary | ICD-10-CM | POA: Diagnosis not present

## 2020-09-18 DIAGNOSIS — Z1159 Encounter for screening for other viral diseases: Secondary | ICD-10-CM | POA: Diagnosis not present

## 2020-09-18 DIAGNOSIS — Z0000 Encounter for general adult medical examination without abnormal findings: Secondary | ICD-10-CM

## 2020-09-18 DIAGNOSIS — Z0001 Encounter for general adult medical examination with abnormal findings: Secondary | ICD-10-CM | POA: Diagnosis not present

## 2020-09-18 MED ORDER — LOSARTAN POTASSIUM 100 MG PO TABS: 100.0000 mg | ORAL_TABLET | Freq: Every day | ORAL | 3 refills | Status: DC

## 2020-09-18 MED ORDER — BUSPIRONE HCL 5 MG PO TABS
5.0000 mg | ORAL_TABLET | Freq: Three times a day (TID) | ORAL | 3 refills | Status: DC | PRN
Start: 2020-09-18 — End: 2021-05-29

## 2020-09-18 MED ORDER — VALACYCLOVIR HCL 1 G PO TABS: 1000.0000 mg | ORAL_TABLET | Freq: Every day | ORAL | 3 refills | Status: DC

## 2020-09-18 MED ORDER — HYDROCHLOROTHIAZIDE 25 MG PO TABS
25.0000 mg | ORAL_TABLET | Freq: Every day | ORAL | 3 refills | Status: DC
Start: 2020-09-18 — End: 2020-09-18

## 2020-09-18 MED ORDER — METOPROLOL SUCCINATE ER 50 MG PO TB24: 50.0000 mg | ORAL_TABLET | Freq: Every day | ORAL | 3 refills | Status: DC

## 2020-09-18 MED ORDER — ATORVASTATIN CALCIUM 20 MG PO TABS: ORAL_TABLET | ORAL | 2 refills | Status: DC

## 2020-09-18 NOTE — Patient Instructions (Signed)

## 2020-09-18 NOTE — Progress Notes (Signed)
Subjective:    Patient ID: Eugene Love, male    DOB: 11-10-1985, 35 y.o.   MRN: [id number]  Chief Complaint  Patient presents with  . Medical Management of Chronic Issues   Pt presents to the office today to recheck HTN and CPE. He has a lot of stress going on. His wife had a CVA and is currently [redacted] weeks pregnant. She has right sided weakness and he is helping her. They have two other children.  Hypertension This is a chronic problem. The current episode started more than 1 year ago. The problem has been waxing and waning since onset. The problem is uncontrolled. Associated symptoms include malaise/fatigue. Pertinent negatives include no peripheral edema or shortness of breath. Risk factors for coronary artery disease include dyslipidemia, obesity, male gender and sedentary lifestyle. The current treatment provides mild improvement.  Hyperlipidemia This is a chronic problem. The current episode started more than 1 year ago. Exacerbating diseases include obesity. Pertinent negatives include no shortness of breath. Current antihyperlipidemic treatment includes statins. The current treatment provides moderate improvement of lipids. Risk factors for coronary artery disease include dyslipidemia, male sex, hypertension and a sedentary lifestyle.  Herpes 1 & 2 Takes Valtrex as needed. States his last flare up was over a year ago.     Review of Systems  Constitutional: Positive for malaise/fatigue.  Respiratory: Negative for shortness of breath.   All other systems reviewed and are negative.  Family History  Problem Relation Age of Onset  . Crohn's disease Father    Social History   Socioeconomic History  . Marital status: Married    Spouse name: Not on file  . Number of children: Not on file  . Years of education: Not on file  . Highest education level: Not on file  Occupational History  . Not on file  Tobacco Use  . Smoking status: Never Smoker  . Smokeless tobacco: Current User     Types: Snuff  . Tobacco comment: chews tobacco  Vaping Use  . Vaping Use: Never used  Substance and Sexual Activity  . Alcohol use: Yes    Comment: 6 beers over the last month   . Drug use: No  . Sexual activity: Not on file  Other Topics Concern  . Not on file  Social History Narrative  . Not on file   Social Determinants of Health   Financial Resource Strain: Not on file  Food Insecurity: Not on file  Transportation Needs: Not on file  Physical Activity: Not on file  Stress: Not on file  Social Connections: Not on file       Objective:   Physical Exam Vitals reviewed.  Constitutional:      General: He is not in acute distress.    Appearance: He is well-developed and well-nourished. He is obese.  HENT:     Head: Normocephalic.     Right Ear: Tympanic membrane normal.     Left Ear: Tympanic membrane normal.     Mouth/Throat:     Mouth: Oropharynx is clear and moist.  Eyes:     General:        Right eye: No discharge.        Left eye: No discharge.     Pupils: Pupils are equal, round, and reactive to light.  Neck:     Thyroid: No thyromegaly.  Cardiovascular:     Rate and Rhythm: Tachycardia present. Rhythm irregular.     Pulses: Intact distal pulses.  Heart sounds: Normal heart sounds. No murmur heard.   Pulmonary:     Effort: Pulmonary effort is normal. No respiratory distress.     Breath sounds: Normal breath sounds. No wheezing.  Abdominal:     General: Bowel sounds are normal. There is no distension.     Palpations: Abdomen is soft.     Tenderness: There is no abdominal tenderness.  Musculoskeletal:        General: No tenderness or edema. Normal range of motion.     Cervical back: Normal range of motion and neck supple.  Skin:    General: Skin is warm and dry.     Findings: No erythema or rash.  Neurological:     Mental Status: He is alert and oriented to person, place, and time.     Cranial Nerves: No cranial nerve deficit.     Deep Tendon  Reflexes: Reflexes are normal and symmetric.  Psychiatric:        Mood and Affect: Mood and affect normal.        Behavior: Behavior normal.        Thought Content: Thought content normal.        Judgment: Judgment normal.       BP (!) 154/98   Pulse (!) 112   Temp 97.9 F (36.6 C) (Temporal)   Ht '5\' 11"'  (1.803 m)   Wt 289 lb 12.8 oz (131.5 kg)   BMI 40.42 kg/m      Assessment & Plan:  Eugene Love comes in today with chief complaint of Medical Management of Chronic Issues   Diagnosis and orders addressed:  1. Essential hypertension Will add metoprolol 50 mg today -Dash diet information given -Exercise encouraged - Stress Management  -Continue current meds -RTO in 2 weeks - losartan (COZAAR) 100 MG tablet; Take 1 tablet (100 mg total) by mouth daily.  Dispense: 90 tablet; Refill: 3 - CMP14+EGFR - CBC with Differential/Platelet - metoprolol succinate (TOPROL-XL) 50 MG 24 hr tablet; Take 1 tablet (50 mg total) by mouth daily. Take with or immediately following a meal.  Dispense: 90 tablet; Refill: 3  2. Herpes - valACYclovir (VALTREX) 1000 MG tablet; Take 1 tablet (1,000 mg total) by mouth daily.  Dispense: 90 tablet; Refill: 3 - CMP14+EGFR - CBC with Differential/Platelet  3. Annual physical exam - CMP14+EGFR - CBC with Differential/Platelet - PSA, total and free - TSH - Hepatitis C antibody - HIV Antibody (routine testing w rflx) - busPIRone (BUSPAR) 5 MG tablet; Take 1 tablet (5 mg total) by mouth 3 (three) times daily as needed.  Dispense: 90 tablet; Refill: 3  4. GAD (generalized anxiety disorder) - Will add Buspar 5 mg today TID prn - CMP14+EGFR - CBC with Differential/Platelet  5. Need for hepatitis C screening test - CMP14+EGFR - CBC with Differential/Platelet - Hepatitis C antibody  6. Encounter for screening for HIV - CMP14+EGFR - CBC with Differential/Platelet - HIV Antibody (routine testing w rflx)  7. Hyperlipidemia, unspecified  hyperlipidemia type - atorvastatin (LIPITOR) 20 MG tablet; TAKE ONE (1) TABLET EACH DAY  Dispense: 90 tablet; Refill: 2 - CMP14+EGFR - CBC with Differential/Platelet  8. Tachycardia - EKG 12-Lead   Labs pending Health Maintenance reviewed Diet and exercise encouraged  Follow up plan: 2 weeks    Evelina Dun, FNP

## 2020-09-19 LAB — CBC WITH DIFFERENTIAL/PLATELET
Basophils Absolute: 0 10*3/uL (ref 0.0–0.2)
Basos: 1 %
EOS (ABSOLUTE): 0.1 10*3/uL (ref 0.0–0.4)
Eos: 2 %
Hematocrit: 46.9 % (ref 37.5–51.0)
Hemoglobin: 15.4 g/dL (ref 13.0–17.7)
Immature Grans (Abs): 0 10*3/uL (ref 0.0–0.1)
Immature Granulocytes: 0 %
Lymphocytes Absolute: 1.8 10*3/uL (ref 0.7–3.1)
Lymphs: 27 %
MCH: 28.6 pg (ref 26.6–33.0)
MCHC: 32.8 g/dL (ref 31.5–35.7)
MCV: 87 fL (ref 79–97)
Monocytes Absolute: 0.4 10*3/uL (ref 0.1–0.9)
Monocytes: 6 %
Neutrophils Absolute: 4.2 10*3/uL (ref 1.4–7.0)
Neutrophils: 64 %
Platelets: 265 10*3/uL (ref 150–450)
RBC: 5.39 x10E6/uL (ref 4.14–5.80)
RDW: 11.3 % — ABNORMAL LOW (ref 11.6–15.4)
WBC: 6.6 10*3/uL (ref 3.4–10.8)

## 2020-09-19 LAB — CMP14+EGFR
ALT: 64 IU/L — ABNORMAL HIGH (ref 0–44)
AST: 33 IU/L (ref 0–40)
Albumin/Globulin Ratio: 1.6 (ref 1.2–2.2)
Albumin: 4.8 g/dL (ref 4.0–5.0)
Alkaline Phosphatase: 72 IU/L (ref 44–121)
BUN/Creatinine Ratio: 13 (ref 9–20)
BUN: 13 mg/dL (ref 6–20)
Bilirubin Total: 0.4 mg/dL (ref 0.0–1.2)
CO2: 23 mmol/L (ref 20–29)
Calcium: 10.2 mg/dL (ref 8.7–10.2)
Chloride: 101 mmol/L (ref 96–106)
Creatinine, Ser: 0.97 mg/dL (ref 0.76–1.27)
GFR calc Af Amer: 119 mL/min/{1.73_m2} (ref >59)
GFR calc non Af Amer: 103 mL/min/{1.73_m2} (ref >59)
Globulin, Total: 3 g/dL (ref 1.5–4.5)
Glucose: 110 mg/dL — ABNORMAL HIGH (ref 65–99)
Potassium: 4.3 mmol/L (ref 3.5–5.2)
Sodium: 139 mmol/L (ref 134–144)
Total Protein: 7.8 g/dL (ref 6.0–8.5)

## 2020-09-19 LAB — TSH: TSH: 1.59 u[IU]/mL (ref 0.450–4.500)

## 2020-09-19 LAB — PSA, TOTAL AND FREE
PSA, Free Pct: 19 %
PSA, Free: 0.19 ng/mL
Prostate Specific Ag, Serum: 1 ng/mL (ref 0.0–4.0)

## 2020-09-19 LAB — HIV ANTIBODY (ROUTINE TESTING W REFLEX): HIV Screen 4th Generation wRfx: NONREACTIVE

## 2020-09-19 LAB — HEPATITIS C ANTIBODY: Hep C Virus Ab: <0.1 s/co ratio (ref 0.0–0.9)

## 2020-10-02 ENCOUNTER — Ambulatory Visit: Payer: BC Managed Care – PPO | Admitting: Family

## 2020-10-03 ENCOUNTER — Ambulatory Visit: Payer: BC Managed Care – PPO | Admitting: Family

## 2020-10-03 ENCOUNTER — Other Ambulatory Visit: Payer: Self-pay

## 2020-10-03 ENCOUNTER — Encounter: Payer: Self-pay | Admitting: Family

## 2020-10-03 VITALS — BP 171/106 | HR 97 | Temp 97.4°F | Ht 71.0 in | Wt 284.8 lb

## 2020-10-03 DIAGNOSIS — F411 Generalized anxiety disorder: Secondary | ICD-10-CM

## 2020-10-03 DIAGNOSIS — I10 Essential (primary) hypertension: Secondary | ICD-10-CM

## 2020-10-03 DIAGNOSIS — E785 Hyperlipidemia, unspecified: Secondary | ICD-10-CM

## 2020-10-03 LAB — LIPID PANEL
Chol/HDL Ratio: 3.5 ratio (ref 0.0–5.0)
Cholesterol, Total: 142 mg/dL (ref 100–199)
HDL: 41 mg/dL (ref >39)
LDL Chol Calc (NIH): 68 mg/dL (ref 0–99)
Triglycerides: 200 mg/dL — ABNORMAL HIGH (ref 0–149)
VLDL Cholesterol Cal: 33 mg/dL (ref 5–40)

## 2020-10-03 LAB — CMP14+EGFR
ALT: 68 IU/L — ABNORMAL HIGH (ref 0–44)
AST: 50 IU/L — ABNORMAL HIGH (ref 0–40)
Albumin/Globulin Ratio: 1.7 (ref 1.2–2.2)
Albumin: 4.7 g/dL (ref 4.0–5.0)
Alkaline Phosphatase: 75 IU/L (ref 44–121)
BUN/Creatinine Ratio: 11 (ref 9–20)
BUN: 11 mg/dL (ref 6–20)
Bilirubin Total: 0.9 mg/dL (ref 0.0–1.2)
CO2: 23 mmol/L (ref 20–29)
Calcium: 9.6 mg/dL (ref 8.7–10.2)
Chloride: 101 mmol/L (ref 96–106)
Creatinine, Ser: 0.96 mg/dL (ref 0.76–1.27)
GFR calc Af Amer: 120 mL/min/{1.73_m2} (ref >59)
GFR calc non Af Amer: 103 mL/min/{1.73_m2} (ref >59)
Globulin, Total: 2.7 g/dL (ref 1.5–4.5)
Glucose: 104 mg/dL — ABNORMAL HIGH (ref 65–99)
Potassium: 4.4 mmol/L (ref 3.5–5.2)
Sodium: 138 mmol/L (ref 134–144)
Total Protein: 7.4 g/dL (ref 6.0–8.5)

## 2020-10-03 MED ORDER — AMLODIPINE BESYLATE 5 MG PO TABS: 5.0000 mg | ORAL_TABLET | Freq: Every day | ORAL | 3 refills | Status: DC

## 2020-10-03 NOTE — Patient Instructions (Signed)
Managing Your Hypertension Hypertension, also called high blood pressure, is when the force of the blood pressing against the walls of the arteries is too strong. Arteries are blood vessels that carry blood from your heart throughout your body. Hypertension forces the heart to work harder to pump blood and may cause the arteries to become narrow or stiff. Understanding blood pressure readings Your personal target blood pressure may vary depending on your medical conditions, your age, and other factors. A blood pressure reading includes a higher number over a lower number. Ideally, your blood pressure should be below 120/80. You should know that:  The first, or top, number is called the systolic pressure. It is a measure of the pressure in your arteries as your heart beats.  The second, or bottom number, is called the diastolic pressure. It is a measure of the pressure in your arteries as the heart relaxes. Blood pressure is classified into four stages. Based on your blood pressure reading, your health care provider may use the following stages to determine what type of treatment you need, if any. Systolic pressure and diastolic pressure are measured in a unit called mmHg. Normal  Systolic pressure: below 120.  Diastolic pressure: below 80. Elevated  Systolic pressure: 120-129.  Diastolic pressure: below 80. Hypertension stage 1  Systolic pressure: 130-139.  Diastolic pressure: 80-89. Hypertension stage 2  Systolic pressure: 140 or above.  Diastolic pressure: 90 or above. How can this condition affect me? Managing your hypertension is an important responsibility. Over time, hypertension can damage the arteries and decrease blood flow to important parts of the body, including the brain, heart, and kidneys. Having untreated or uncontrolled hypertension can lead to:  A heart attack.  A stroke.  A weakened blood vessel (aneurysm).  Heart failure.  Kidney damage.  Eye  damage.  Metabolic syndrome.  Memory and concentration problems.  Vascular dementia. What actions can I take to manage this condition? Hypertension can be managed by making lifestyle changes and possibly by taking medicines. Your health care provider will help you make a plan to bring your blood pressure within a normal range. Nutrition  Eat a diet that is high in fiber and potassium, and low in salt (sodium), added sugar, and fat. An example eating plan is called the Dietary Approaches to Stop Hypertension (DASH) diet. To eat this way: ? Eat plenty of fresh fruits and vegetables. Try to fill one-half of your plate at each meal with fruits and vegetables. ? Eat whole grains, such as whole-wheat pasta, brown rice, or whole-grain bread. Fill about one-fourth of your plate with whole grains. ? Eat low-fat dairy products. ? Avoid fatty cuts of meat, processed or cured meats, and poultry with skin. Fill about one-fourth of your plate with lean proteins such as fish, chicken without skin, beans, eggs, and tofu. ? Avoid pre-made and processed foods. These tend to be higher in sodium, added sugar, and fat.  Reduce your daily sodium intake. Most people with hypertension should eat less than 1,500 mg of sodium a day.   Lifestyle  Work with your health care provider to maintain a healthy body weight or to lose weight. Ask what an ideal weight is for you.  Get at least 30 minutes of exercise that causes your heart to beat faster (aerobic exercise) most days of the week. Activities may include walking, swimming, or biking.  Include exercise to strengthen your muscles (resistance exercise), such as weight lifting, as part of your weekly exercise routine. Try   to do these types of exercises for 30 minutes at least 3 days a week.  Do not use any products that contain nicotine or tobacco, such as cigarettes, e-cigarettes, and chewing tobacco. If you need help quitting, ask your health care  provider.  Control any long-term (chronic) conditions you have, such as high cholesterol or diabetes.  Identify your sources of stress and find ways to manage stress. This may include meditation, deep breathing, or making time for fun activities.   Alcohol use  Do not drink alcohol if: ? Your health care provider tells you not to drink. ? You are pregnant, may be pregnant, or are planning to become pregnant.  If you drink alcohol: ? Limit how much you use to:  0-1 drink a day for women.  0-2 drinks a day for men. ? Be aware of how much alcohol is in your drink. In the [place] one drink equals one 12 oz bottle of beer (355 mL), one 5 oz glass of wine (148 mL), or one 1 oz glass of hard liquor (44 mL). Medicines Your health care provider may prescribe medicine if lifestyle changes are not enough to get your blood pressure under control and if:  Your systolic blood pressure is 130 or higher.  Your diastolic blood pressure is 80 or higher. Take medicines only as told by your health care provider. Follow the directions carefully. Blood pressure medicines must be taken as told by your health care provider. The medicine does not work as well when you skip doses. Skipping doses also puts you at risk for problems. Monitoring Before you monitor your blood pressure:  Do not smoke, drink caffeinated beverages, or exercise within 30 minutes before taking a measurement.  Use the bathroom and empty your bladder (urinate).  Sit quietly for at least 5 minutes before taking measurements. Monitor your blood pressure at home as told by your health care provider. To do this:  Sit with your back straight and supported.  Place your feet flat on the floor. Do not cross your legs.  Support your arm on a flat surface, such as a table. Make sure your upper arm is at heart level.  Each time you measure, take two or three readings one minute apart and record the results. You may also need to have your  blood pressure checked regularly by your health care provider.   General information  Talk with your health care provider about your diet, exercise habits, and other lifestyle factors that may be contributing to hypertension.  Review all the medicines you take with your health care provider because there may be side effects or interactions.  Keep all visits as told by your health care provider. Your health care provider can help you create and adjust your plan for managing your high blood pressure. Where to find more information  National Heart, Lung, and Blood Institute: www.nhlbi.nih.gov  American Heart Association: www.heart.org Contact a health care provider if:  You think you are having a reaction to medicines you have taken.  You have repeated (recurrent) headaches.  You feel dizzy.  You have swelling in your ankles.  You have trouble with your vision. Get help right away if:  You develop a severe headache or confusion.  You have unusual weakness or numbness, or you feel faint.  You have severe pain in your chest or abdomen.  You vomit repeatedly.  You have trouble breathing. These symptoms may represent a serious problem that is an emergency. Do not wait   to see if the symptoms will go away. Get medical help right away. Call your local emergency services (911 in the [place] Do not drive yourself to the hospital. Summary  Hypertension is when the force of blood pumping through your arteries is too strong. If this condition is not controlled, it may put you at risk for serious complications.  Your personal target blood pressure may vary depending on your medical conditions, your age, and other factors. For most people, a normal blood pressure is less than 120/80.  Hypertension is managed by lifestyle changes, medicines, or both.  Lifestyle changes to help manage hypertension include losing weight, eating a healthy, low-sodium diet, exercising more, stopping smoking, and  limiting alcohol. This information is not intended to replace advice given to you by your health care provider. Make sure you discuss any questions you have with your health care provider. Document Revised: 09/17/2019 Document Reviewed: 07/13/2019 Elsevier Patient Education  2021 [employer] Inc.  

## 2020-10-03 NOTE — Progress Notes (Signed)
Subjective:    Patient ID: Eugene Love, male    DOB: 08-03-1986, 35 y.o.   MRN: [id number]  Chief Complaint  Patient presents with  . Hypertension    Morning times to seem to be when it is the highest he takes med between 6-7am   Pt presents to the office today to recheck HTN. PT's BP is not at goal. He reports his BP in the evening at home have been slightly better at 150's/90's.  Hypertension This is a chronic problem. The current episode started more than 1 year ago. The problem has been waxing and waning since onset. The problem is uncontrolled. Associated symptoms include anxiety. Pertinent negatives include no malaise/fatigue, peripheral edema or shortness of breath. Past treatments include beta blockers and angiotensin blockers. The current treatment provides mild improvement.  Hyperlipidemia This is a chronic problem. The current episode started more than 1 year ago. The problem is uncontrolled. Recent lipid tests were reviewed and are high. Exacerbating diseases include obesity. Pertinent negatives include no shortness of breath. Current antihyperlipidemic treatment includes statins. The current treatment provides moderate improvement of lipids. Risk factors for coronary artery disease include dyslipidemia, male sex, hypertension and a sedentary lifestyle.  Anxiety Presents for follow-up visit. Symptoms include excessive worry and nervous/anxious behavior. Patient reports no irritability, restlessness or shortness of breath. Symptoms occur occasionally. The severity of symptoms is moderate. The quality of sleep is good.        Review of Systems  Constitutional: Negative for irritability and malaise/fatigue.  Respiratory: Negative for shortness of breath.   Psychiatric/Behavioral: The patient is nervous/anxious.   All other systems reviewed and are negative.      Objective:   Physical Exam Vitals reviewed.  Constitutional:      General: He is not in acute distress.     Appearance: He is well-developed and well-nourished. He is obese.  HENT:     Head: Normocephalic.     Right Ear: Tympanic membrane normal.     Left Ear: Tympanic membrane normal.     Mouth/Throat:     Mouth: Oropharynx is clear and moist.  Eyes:     General:        Right eye: No discharge.        Left eye: No discharge.     Pupils: Pupils are equal, round, and reactive to light.  Neck:     Thyroid: No thyromegaly.  Cardiovascular:     Rate and Rhythm: Normal rate and regular rhythm.     Pulses: Intact distal pulses.     Heart sounds: Normal heart sounds. No murmur heard.   Pulmonary:     Effort: Pulmonary effort is normal. No respiratory distress.     Breath sounds: Normal breath sounds. No wheezing.  Abdominal:     General: Bowel sounds are normal. There is no distension.     Palpations: Abdomen is soft.     Tenderness: There is no abdominal tenderness.  Musculoskeletal:        General: No tenderness or edema. Normal range of motion.     Cervical back: Normal range of motion and neck supple.  Skin:    General: Skin is warm and dry.     Findings: No erythema or rash.  Neurological:     Mental Status: He is alert and oriented to person, place, and time.     Cranial Nerves: No cranial nerve deficit.     Deep Tendon Reflexes: Reflexes are normal and symmetric.  Psychiatric:        Mood and Affect: Mood and affect normal.        Behavior: Behavior normal.        Thought Content: Thought content normal.        Judgment: Judgment normal.       BP (!) 171/106   Pulse 97   Temp (!) 97.4 F (36.3 C) (Temporal)   Ht 5' 11" (1.803 m)   Wt 284 lb 12.8 oz (129.2 kg)   BMI 39.72 kg/m      Assessment & Plan:  Eugene Love comes in today with chief complaint of Hypertension (Morning times to seem to be when it is the highest he takes med between 6-7am)   Diagnosis and orders addressed:  1. Essential hypertension Will add Norvasc 5 mg  Per patient BP at home is  150;s/90s.  -Dash diet information given -Exercise encouraged - Stress Management  -Continue current meds -RTO in 2 weeks  - amLODipine (NORVASC) 5 MG tablet; Take 1 tablet (5 mg total) by mouth daily.  Dispense: 90 tablet; Refill: 3 - CMP14+EGFR  2. GAD (generalized anxiety disorder) A lot of stress at home. He has only been taking Buspar as needed.  Discussed he will start taking every day to see if this helps with anxiety.  - CMP14+EGFR  3. Hyperlipidemia, unspecified hyperlipidemia type - CMP14+EGFR - Lipid panel   Labs pending Health Maintenance reviewed Diet and exercise encouraged  Follow up plan: 2 weeks    Evelina Dun, FNP

## 2020-10-17 ENCOUNTER — Encounter: Payer: Self-pay | Admitting: Family

## 2020-10-17 ENCOUNTER — Ambulatory Visit (INDEPENDENT_AMBULATORY_CARE_PROVIDER_SITE_OTHER): Payer: BC Managed Care – PPO | Admitting: Family

## 2020-10-17 ENCOUNTER — Other Ambulatory Visit: Payer: Self-pay

## 2020-10-17 VITALS — BP 133/87 | HR 73 | Temp 98.0°F | Ht 71.0 in | Wt 287.4 lb

## 2020-10-17 DIAGNOSIS — I10 Essential (primary) hypertension: Secondary | ICD-10-CM | POA: Diagnosis not present

## 2020-10-17 DIAGNOSIS — F411 Generalized anxiety disorder: Secondary | ICD-10-CM | POA: Diagnosis not present

## 2020-10-17 NOTE — Patient Instructions (Signed)

## 2020-10-17 NOTE — Progress Notes (Signed)
Subjective:    Patient ID: Eugene Love, male    DOB: Feb 27, 1986, 35 y.o.   MRN: [id number]  Chief Complaint  Patient presents with  . Medical Management of Chronic Issues   PT presents to the office today to recheck HTN. PT was seen two weeks ago and we started Norvasc 5 mg. He has continued his metoprolol 50 mg and losartan 100 mg.  Hypertension This is a chronic problem. The current episode started more than 1 year ago. The problem has been resolved since onset. The problem is controlled. Associated symptoms include anxiety. Pertinent negatives include no malaise/fatigue, peripheral edema or shortness of breath. Risk factors for coronary artery disease include dyslipidemia, obesity and male gender. Past treatments include beta blockers, calcium channel blockers and angiotensin blockers. The current treatment provides moderate improvement.  Anxiety Presents for follow-up visit. Symptoms include excessive worry, irritability and nervous/anxious behavior. Patient reports no shortness of breath. Symptoms occur most days. The severity of symptoms is moderate.        Review of Systems  Constitutional: Positive for irritability. Negative for malaise/fatigue.  Respiratory: Negative for shortness of breath.   Psychiatric/Behavioral: The patient is nervous/anxious.   All other systems reviewed and are negative.      Objective:   Physical Exam Vitals reviewed.  Constitutional:      General: He is not in acute distress.    Appearance: He is well-developed and well-nourished.  HENT:     Head: Normocephalic.     Right Ear: Tympanic membrane normal.     Left Ear: Tympanic membrane normal.     Mouth/Throat:     Mouth: Oropharynx is clear and moist.  Eyes:     General:        Right eye: No discharge.        Left eye: No discharge.     Pupils: Pupils are equal, round, and reactive to light.  Neck:     Thyroid: No thyromegaly.  Cardiovascular:     Rate and Rhythm: Normal rate and  regular rhythm.     Pulses: Intact distal pulses.     Heart sounds: Normal heart sounds. No murmur heard.   Pulmonary:     Effort: Pulmonary effort is normal. No respiratory distress.     Breath sounds: Normal breath sounds. No wheezing.  Abdominal:     General: Bowel sounds are normal. There is no distension.     Palpations: Abdomen is soft.     Tenderness: There is no abdominal tenderness.  Musculoskeletal:        General: No tenderness or edema. Normal range of motion.     Cervical back: Normal range of motion and neck supple.  Skin:    General: Skin is warm and dry.     Findings: No erythema or rash.  Neurological:     Mental Status: He is alert and oriented to person, place, and time.     Cranial Nerves: No cranial nerve deficit.     Deep Tendon Reflexes: Reflexes are normal and symmetric.  Psychiatric:        Mood and Affect: Mood and affect normal.        Behavior: Behavior normal.        Thought Content: Thought content normal.        Judgment: Judgment normal.     BP 133/87   Pulse 73   Temp 98 F (36.7 C) (Temporal)   Ht '5\' 11"'  (1.803 m)   Wt 287  lb 6.4 oz (130.4 kg)   SpO2 100%   BMI 40.08 kg/m      Assessment & Plan:  Eugene Love comes in today with chief complaint of Medical Management of Chronic Issues   Diagnosis and orders addressed:  1. Essential hypertension -Daily blood pressure log given with instructions on how to fill out and told to bring to next visit -Dash diet information given -Exercise encouraged - Stress Management  -Continue current meds -RTO in 6 months  - CMP14+EGFR  2. GAD (generalized anxiety disorder) - CMP14+EGFR     Evelina Dun, FNP

## 2020-10-18 LAB — CMP14+EGFR
ALT: 76 IU/L — ABNORMAL HIGH (ref 0–44)
AST: 40 IU/L (ref 0–40)
Albumin/Globulin Ratio: 1.8 (ref 1.2–2.2)
Albumin: 4.8 g/dL (ref 4.0–5.0)
Alkaline Phosphatase: 61 IU/L (ref 44–121)
BUN/Creatinine Ratio: 11 (ref 9–20)
BUN: 10 mg/dL (ref 6–20)
Bilirubin Total: 0.5 mg/dL (ref 0.0–1.2)
CO2: 22 mmol/L (ref 20–29)
Calcium: 9.6 mg/dL (ref 8.7–10.2)
Chloride: 101 mmol/L (ref 96–106)
Creatinine, Ser: 0.93 mg/dL (ref 0.76–1.27)
GFR calc Af Amer: 124 mL/min/{1.73_m2} (ref >59)
GFR calc non Af Amer: 107 mL/min/{1.73_m2} (ref >59)
Globulin, Total: 2.7 g/dL (ref 1.5–4.5)
Glucose: 88 mg/dL (ref 65–99)
Potassium: 4.6 mmol/L (ref 3.5–5.2)
Sodium: 139 mmol/L (ref 134–144)
Total Protein: 7.5 g/dL (ref 6.0–8.5)

## 2020-12-13 ENCOUNTER — Other Ambulatory Visit: Payer: Self-pay

## 2020-12-13 ENCOUNTER — Encounter: Payer: Self-pay | Admitting: Nurse Practitioner

## 2020-12-13 ENCOUNTER — Ambulatory Visit: Payer: BC Managed Care – PPO | Admitting: Nurse Practitioner

## 2020-12-13 VITALS — BP 133/83 | HR 82 | Temp 97.8°F | Ht 71.0 in | Wt 287.0 lb

## 2020-12-13 DIAGNOSIS — H6690 Otitis media, unspecified, unspecified ear: Secondary | ICD-10-CM | POA: Insufficient documentation

## 2020-12-13 DIAGNOSIS — H60312 Diffuse otitis externa, left ear: Secondary | ICD-10-CM | POA: Insufficient documentation

## 2020-12-13 MED ORDER — CIPROFLOXACIN HCL 500 MG PO TABS: 500.0000 mg | ORAL_TABLET | Freq: Two times a day (BID) | ORAL | 0 refills | Status: DC

## 2020-12-13 NOTE — Progress Notes (Signed)
Acute Office Visit  Subjective:    Patient ID: Eugene Love, male    DOB: 04/18/1986, 35 y.o.   MRN: [id number]  Chief Complaint  Patient presents with  . Ear Pain    Otalgia  There is pain in the left ear. This is a chronic problem. The current episode started 1 to 4 weeks ago. The problem occurs constantly. The problem has been gradually worsening. There has been no fever. The pain is mild. Pertinent negatives include no headaches, hearing loss, rash or sore throat. His past medical history is significant for a chronic ear infection.    Past Medical History:  Diagnosis Date  . Cellulitis    inner thigh   . History of kidney stones   . History of right flank pain   . Hypertension     Past Surgical History:  Procedure Laterality Date  . CYSTOSCOPY WITH RETROGRADE PYELOGRAM, URETEROSCOPY AND STENT PLACEMENT Right 01/06/2017   Procedure: CYSTOSCOPY WITH RIGHT RETROGRADE PYELOGRAM, URETEROSCOPY WITH STONE REMOVAL;  Surgeon: Heloise Purpura, MD;  Location: [facility];  Service: Urology;  Laterality: Right;  . HOLMIUM LASER APPLICATION Right 01/06/2017   Procedure: HOLMIUM LASER APPLICATION;  Surgeon: Heloise Purpura, MD;  Location: [facility];  Service: Urology;  Laterality: Right;  . KNEE ARTHROTOMY Right   . URETHRAL DILATION      Family History  Problem Relation Age of Onset  . Crohn's disease Father     Social History   Socioeconomic History  . Marital status: Married    Spouse name: Not on file  . Number of children: Not on file  . Years of education: Not on file  . Highest education level: Not on file  Occupational History  . Not on file  Tobacco Use  . Smoking status: Never Smoker  . Smokeless tobacco: Current User    Types: Snuff  . Tobacco comment: chews tobacco  Vaping Use  . Vaping Use: Never used  Substance and Sexual Activity  . Alcohol use: Yes    Comment: 6 beers over the last month   . Drug use: No  . Sexual activity: Not on file  Other Topics Concern  .  Not on file  Social History Narrative  . Not on file   Social Determinants of Health   Financial Resource Strain: Not on file  Food Insecurity: Not on file  Transportation Needs: Not on file  Physical Activity: Not on file  Stress: Not on file  Social Connections: Not on file  Intimate Partner Violence: Not on file    Outpatient Medications Prior to Visit  Medication Sig Dispense Refill  . amLODipine (NORVASC) 5 MG tablet Take 1 tablet (5 mg total) by mouth daily. 90 tablet 3  . atorvastatin (LIPITOR) 20 MG tablet TAKE ONE (1) TABLET EACH DAY 90 tablet 2  . busPIRone (BUSPAR) 5 MG tablet Take 1 tablet (5 mg total) by mouth 3 (three) times daily as needed. 90 tablet 3  . losartan (COZAAR) 100 MG tablet Take 1 tablet (100 mg total) by mouth daily. 90 tablet 3  . metoprolol succinate (TOPROL-XL) 50 MG 24 hr tablet Take 1 tablet (50 mg total) by mouth daily. Take with or immediately following a meal. 90 tablet 3  . Multiple Vitamins-Minerals (MULTIVITAMIN WITH MINERALS) tablet Take 1 tablet by mouth daily.    . valACYclovir (VALTREX) 1000 MG tablet Take 1 tablet (1,000 mg total) by mouth daily. 90 tablet 3   No facility-administered medications prior to visit.  Allergies  Allergen Reactions  . Lisinopril     cough    Review of Systems  Constitutional: Negative.   HENT: Positive for ear pain. Negative for hearing loss and sore throat.   Cardiovascular: Negative.   Gastrointestinal: Negative.   Musculoskeletal: Negative.   Skin: Negative for rash.  Neurological: Negative for headaches.  All other systems reviewed and are negative.      Objective:    Physical Exam Vitals and nursing note reviewed.  Constitutional:      Appearance: Normal appearance.  HENT:     Head: Normocephalic.     Right Ear: Hearing normal. There is impacted cerumen. No foreign body.     Left Ear: Hearing normal. Drainage and tenderness present. There is impacted cerumen. No foreign body.      Nose: Nose normal.     Mouth/Throat:     Mouth: Mucous membranes are moist.     Pharynx: Oropharynx is clear.  Eyes:     Conjunctiva/sclera: Conjunctivae normal.  Cardiovascular:     Pulses: Normal pulses.     Heart sounds: Normal heart sounds.  Pulmonary:     Effort: Pulmonary effort is normal.     Breath sounds: Normal breath sounds.  Abdominal:     General: Bowel sounds are normal.  Skin:    Findings: No rash.  Neurological:     Mental Status: He is alert and oriented to person, place, and time.  Psychiatric:        Behavior: Behavior normal.     BP 133/83   Pulse 82   Temp 97.8 F (36.6 C) (Temporal)   Ht 5\' 11"  (1.803 m)   Wt 287 lb (130.2 kg)   BMI 40.03 kg/m  Wt Readings from Last 3 Encounters:  12/13/20 287 lb (130.2 kg)  10/17/20 287 lb 6.4 oz (130.4 kg)  10/03/20 284 lb 12.8 oz (129.2 kg)    Health Maintenance Due  Topic Date Due  . COVID-19 Vaccine (3 - Booster for Pfizer series) 05/23/2020    There are no preventive care reminders to display for this patient.   Lab Results  Component Value Date   TSH 1.590 09/18/2020   Lab Results  Component Value Date   WBC 6.6 09/18/2020   HGB 15.4 09/18/2020   HCT 46.9 09/18/2020   MCV 87 09/18/2020   PLT 265 09/18/2020   Lab Results  Component Value Date   NA 139 10/17/2020   K 4.6 10/17/2020   CO2 22 10/17/2020   GLUCOSE 88 10/17/2020   BUN 10 10/17/2020   CREATININE 0.93 10/17/2020   BILITOT 0.5 10/17/2020   ALKPHOS 61 10/17/2020   AST 40 10/17/2020   ALT 76 (H) 10/17/2020   PROT 7.5 10/17/2020   ALBUMIN 4.8 10/17/2020   CALCIUM 9.6 10/17/2020   ANIONGAP 9 07/01/2018   Lab Results  Component Value Date   CHOL 142 10/03/2020   Lab Results  Component Value Date   HDL 41 10/03/2020   Lab Results  Component Value Date   LDLCALC 68 10/03/2020   Lab Results  Component Value Date   TRIG 200 (H) 10/03/2020   Lab Results  Component Value Date   CHOLHDL 3.5 10/03/2020   No results  found for: HGBA1C     Assessment & Plan:   Problem List Items Addressed This Visit      Nervous and Auditory   Chronic diffuse otitis externa of left ear - Primary    Worsening signs and  symptoms of otitis externa.  Patient reported going white water rafting in the past and developed ear infections where ENT had given him powder medication that seemed to help better than any ear drops that worsened her situation.  On assessment bilateral ear impaction with left ear canal erythematous and tender.  Right ear the impacted and flushed in clinic.  Started patient on Cipro 500 mg twice daily for 7 days.  Education provided to patient with printed handouts given.  Encourage patient to follow-up with worsening unresolved symptoms.      Relevant Medications   ciprofloxacin (CIPRO) 500 MG tablet       Meds ordered this encounter  Medications  . ciprofloxacin (CIPRO) 500 MG tablet    Sig: Take 1 tablet (500 mg total) by mouth 2 (two) times daily.    Dispense:  14 tablet    Refill:  0    Order Specific Question:   Supervising Provider    Answer:   Raliegh Ip [[id number]]     Daryll Drown, NP

## 2020-12-13 NOTE — Patient Instructions (Signed)

## 2020-12-13 NOTE — Assessment & Plan Note (Signed)
Worsening signs and symptoms of otitis externa.  Patient reported going white water rafting in the past and developed ear infections where ENT had given him powder medication that seemed to help better than any ear drops that worsened her situation.  On assessment bilateral ear impaction with left ear canal erythematous and tender.  Right ear the impacted and flushed in clinic.  Started patient on Cipro 500 mg twice daily for 7 days.  Education provided to patient with printed handouts given.  Encourage patient to follow-up with worsening unresolved symptoms.

## 2021-02-21 ENCOUNTER — Ambulatory Visit: Payer: BC Managed Care – PPO | Admitting: Family

## 2021-02-21 ENCOUNTER — Encounter: Payer: Self-pay | Admitting: Family

## 2021-02-21 ENCOUNTER — Other Ambulatory Visit: Payer: Self-pay

## 2021-02-21 VITALS — BP 135/84 | HR 100 | Temp 98.7°F | Ht 71.0 in | Wt 287.4 lb

## 2021-02-21 DIAGNOSIS — I10 Essential (primary) hypertension: Secondary | ICD-10-CM

## 2021-02-21 DIAGNOSIS — F5101 Primary insomnia: Secondary | ICD-10-CM | POA: Diagnosis not present

## 2021-02-21 DIAGNOSIS — F411 Generalized anxiety disorder: Secondary | ICD-10-CM

## 2021-02-21 MED ORDER — HYDROCHLOROTHIAZIDE 12.5 MG PO TABS: 12.5000 mg | ORAL_TABLET | Freq: Every day | ORAL | 3 refills | Status: DC

## 2021-02-21 NOTE — Progress Notes (Signed)
Subjective:    Patient ID: Eugene Love, male    DOB: 1986-03-03, 35 y.o.   MRN: [id number]  Chief Complaint  Patient presents with   Edema    Both legs swelling been going on since started the amolodipine.    PT presents to the office today for chronic follow up.  Hypertension This is a chronic problem. The current episode started more than 1 year ago. The problem has been resolved since onset. Associated symptoms include anxiety, malaise/fatigue and peripheral edema. Pertinent negatives include no shortness of breath. Risk factors for coronary artery disease include dyslipidemia, obesity, male gender and sedentary lifestyle. The current treatment provides moderate improvement.  Hyperlipidemia This is a chronic problem. The current episode started more than 1 year ago. The problem is controlled. Exacerbating diseases include obesity. Pertinent negatives include no shortness of breath. Current antihyperlipidemic treatment includes statins. The current treatment provides moderate improvement of lipids. Risk factors for coronary artery disease include dyslipidemia, male sex, hypertension and a sedentary lifestyle.  Anxiety Presents for follow-up visit. Symptoms include depressed mood, excessive worry, irritability and nervous/anxious behavior. Patient reports no shortness of breath. Symptoms occur occasionally. The severity of symptoms is moderate.       Review of Systems  Constitutional:  Positive for irritability and malaise/fatigue.  Respiratory:  Negative for shortness of breath.   Psychiatric/Behavioral:  The patient is nervous/anxious.   All other systems reviewed and are negative.     Objective:   Physical Exam Vitals reviewed.  Constitutional:      General: He is not in acute distress.    Appearance: He is well-developed.  HENT:     Head: Normocephalic.     Right Ear: Tympanic membrane normal.     Left Ear: Tympanic membrane normal.  Eyes:     General:        Right eye:  No discharge.        Left eye: No discharge.     Pupils: Pupils are equal, round, and reactive to light.  Neck:     Thyroid: No thyromegaly.  Cardiovascular:     Rate and Rhythm: Normal rate and regular rhythm.     Heart sounds: Normal heart sounds. No murmur heard. Pulmonary:     Effort: Pulmonary effort is normal. No respiratory distress.     Breath sounds: Normal breath sounds. No wheezing.  Abdominal:     General: Bowel sounds are normal. There is no distension.     Palpations: Abdomen is soft.     Tenderness: There is no abdominal tenderness.  Musculoskeletal:        General: No tenderness. Normal range of motion.     Cervical back: Normal range of motion and neck supple.  Skin:    General: Skin is warm and dry.     Findings: No erythema or rash.  Neurological:     Mental Status: He is alert and oriented to person, place, and time.     Cranial Nerves: No cranial nerve deficit.     Deep Tendon Reflexes: Reflexes are normal and symmetric.  Psychiatric:        Behavior: Behavior normal.        Thought Content: Thought content normal.        Judgment: Judgment normal.      BP 135/84   Pulse 100   Temp 98.7 F (37.1 C) (Temporal)   Ht 5\' 11"  (1.803 m)   Wt 287 lb 6.4 oz (130.4 kg)  BMI 40.08 kg/m      Assessment & Plan:  Eugene Love comes in today with chief complaint of Edema (Both legs swelling been going on since started the amolodipine. )   Diagnosis and orders addressed:  1. Essential hypertension Will add HCTZ 12.5 mg  -Daily blood pressure log given with instructions on how to fill out and told to bring to next visit -Dash diet information given -Exercise encouraged - Stress Management  -Continue current meds -RTO in 2 weeks  - hydrochlorothiazide (HYDRODIURIL) 12.5 MG tablet; Take 1 tablet (12.5 mg total) by mouth daily.  Dispense: 90 tablet; Refill: 3  2. Primary insomnia  3. GAD (generalized anxiety disorder)   Labs pending Health  Maintenance reviewed Diet and exercise encouraged  Follow up plan: 2-4 weeks    Jannifer Rodney, FNP

## 2021-02-21 NOTE — Patient Instructions (Signed)

## 2021-03-16 ENCOUNTER — Ambulatory Visit: Payer: BC Managed Care – PPO | Admitting: Family

## 2021-03-27 ENCOUNTER — Other Ambulatory Visit: Payer: Self-pay

## 2021-03-27 ENCOUNTER — Ambulatory Visit: Payer: BC Managed Care – PPO | Admitting: Family

## 2021-03-27 ENCOUNTER — Encounter: Payer: Self-pay | Admitting: Family

## 2021-03-27 VITALS — BP 132/88 | HR 106 | Temp 98.3°F | Ht 71.0 in | Wt 292.4 lb

## 2021-03-27 DIAGNOSIS — I10 Essential (primary) hypertension: Secondary | ICD-10-CM | POA: Diagnosis not present

## 2021-03-27 DIAGNOSIS — F411 Generalized anxiety disorder: Secondary | ICD-10-CM | POA: Diagnosis not present

## 2021-03-27 DIAGNOSIS — E785 Hyperlipidemia, unspecified: Secondary | ICD-10-CM

## 2021-03-27 DIAGNOSIS — F5101 Primary insomnia: Secondary | ICD-10-CM | POA: Diagnosis not present

## 2021-03-27 NOTE — Progress Notes (Signed)
Subjective:    Patient ID: Eugene Love, male    DOB: Jul 29, 1986, 35 y.o.   MRN: [id number]  Chief Complaint  Patient presents with   Medical Management of Chronic Issues   PT presents to the office today for chronic follow up.  Hypertension This is a chronic problem. The current episode started more than 1 year ago. The problem has been waxing and waning since onset. The problem is uncontrolled. Associated symptoms include anxiety and peripheral edema. Pertinent negatives include no malaise/fatigue or shortness of breath. The current treatment provides moderate improvement.  Hyperlipidemia This is a chronic problem. The current episode started more than 1 year ago. The problem is controlled. Exacerbating diseases include obesity. Pertinent negatives include no shortness of breath. Current antihyperlipidemic treatment includes statins. The current treatment provides moderate improvement of lipids. Risk factors for coronary artery disease include dyslipidemia, hypertension, male sex and a sedentary lifestyle.  Anxiety Presents for follow-up visit. Symptoms include depressed mood, excessive worry, insomnia, irritability and nervous/anxious behavior. Patient reports no shortness of breath. Symptoms occur most days. The severity of symptoms is moderate.    Insomnia Primary symptoms: difficulty falling asleep, frequent awakening, no malaise/fatigue.   The current episode started more than one year. The onset quality is gradual. The problem occurs intermittently.     Review of Systems  Constitutional:  Positive for irritability. Negative for malaise/fatigue.  Respiratory:  Negative for shortness of breath.   Psychiatric/Behavioral:  The patient is nervous/anxious and has insomnia.   All other systems reviewed and are negative.     Objective:   Physical Exam Vitals reviewed.  Constitutional:      General: He is not in acute distress.    Appearance: He is well-developed. He is obese.   HENT:     Head: Normocephalic.     Right Ear: Tympanic membrane normal.     Left Ear: Tympanic membrane normal.  Eyes:     General:        Right eye: No discharge.        Left eye: No discharge.     Pupils: Pupils are equal, round, and reactive to light.  Neck:     Thyroid: No thyromegaly.  Cardiovascular:     Rate and Rhythm: Normal rate and regular rhythm.     Heart sounds: Normal heart sounds. No murmur heard. Pulmonary:     Effort: Pulmonary effort is normal. No respiratory distress.     Breath sounds: Normal breath sounds. No wheezing.  Abdominal:     General: Bowel sounds are normal. There is no distension.     Palpations: Abdomen is soft.     Tenderness: There is no abdominal tenderness.  Musculoskeletal:        General: No tenderness. Normal range of motion.     Cervical back: Normal range of motion and neck supple.  Skin:    General: Skin is warm and dry.     Findings: No erythema or rash.  Neurological:     Mental Status: He is alert and oriented to person, place, and time.     Cranial Nerves: No cranial nerve deficit.     Deep Tendon Reflexes: Reflexes are normal and symmetric.  Psychiatric:        Behavior: Behavior normal.        Thought Content: Thought content normal.        Judgment: Judgment normal.     BP 132/88 Comment: patient reports BP at home 132/88  Pulse Marland Kitchen)  106   Temp 98.3 F (36.8 C) (Temporal)   Ht 5' 11" (1.803 m)   Wt 292 lb 6.4 oz (132.6 kg)   BMI 40.78 kg/m      Assessment & Plan:  Eugene Love comes in today with chief complaint of Medical Management of Chronic Issues   Diagnosis and orders addressed:  1. Essential hypertension - BMP8+EGFR  2. GAD (generalized anxiety disorder) - BMP8+EGFR  3. Hyperlipidemia, unspecified hyperlipidemia type - BMP8+EGFR  4. Primary insomnia - BMP8+EGFR   Labs pending Health Maintenance reviewed Diet and exercise encouraged  Follow up plan: 6 months   Evelina Dun, FNP

## 2021-03-27 NOTE — Patient Instructions (Signed)

## 2021-03-28 LAB — BMP8+EGFR
BUN/Creatinine Ratio: 15 (ref 9–20)
BUN: 15 mg/dL (ref 6–20)
CO2: 23 mmol/L (ref 20–29)
Calcium: 9.8 mg/dL (ref 8.7–10.2)
Chloride: 104 mmol/L (ref 96–106)
Creatinine, Ser: 1 mg/dL (ref 0.76–1.27)
Glucose: 99 mg/dL (ref 65–99)
Potassium: 4.7 mmol/L (ref 3.5–5.2)
Sodium: 142 mmol/L (ref 134–144)
eGFR: 102 mL/min/{1.73_m2} (ref >59)

## 2021-04-17 ENCOUNTER — Ambulatory Visit: Payer: BC Managed Care – PPO | Admitting: Family

## 2021-04-18 ENCOUNTER — Encounter: Payer: Self-pay | Admitting: *Deleted

## 2021-05-28 ENCOUNTER — Other Ambulatory Visit: Payer: Self-pay | Admitting: Family

## 2021-05-28 DIAGNOSIS — Z0000 Encounter for general adult medical examination without abnormal findings: Secondary | ICD-10-CM

## 2021-06-22 ENCOUNTER — Other Ambulatory Visit: Payer: Self-pay | Admitting: Family

## 2021-06-22 DIAGNOSIS — I10 Essential (primary) hypertension: Secondary | ICD-10-CM

## 2021-07-06 ENCOUNTER — Encounter: Payer: Self-pay | Admitting: Family Medicine

## 2021-07-06 ENCOUNTER — Ambulatory Visit: Payer: BC Managed Care – PPO | Admitting: Family Medicine

## 2021-07-06 DIAGNOSIS — R509 Fever, unspecified: Secondary | ICD-10-CM

## 2021-07-06 DIAGNOSIS — J101 Influenza due to other identified influenza virus with other respiratory manifestations: Secondary | ICD-10-CM

## 2021-07-06 DIAGNOSIS — J069 Acute upper respiratory infection, unspecified: Secondary | ICD-10-CM | POA: Diagnosis not present

## 2021-07-06 NOTE — Progress Notes (Signed)
Virtual Visit via telephone Note Due to COVID-19 pandemic this visit was conducted virtually. This visit type was conducted due to national recommendations for restrictions regarding the COVID-19 Pandemic (e.g. social distancing, sheltering in place) in an effort to limit this patient's exposure and mitigate transmission in our community. All issues noted in this document were discussed and addressed.  A physical exam was not performed with this format.   I connected with [name] on 07/06/2021 at 1000 by telephone and verified that I am speaking with the correct person using two identifiers. Eugene Love is currently located at home and patient is currently with them during visit. The provider, Kari Baars, FNP is located in their office at time of visit.  I discussed the limitations, risks, security and privacy concerns of performing an evaluation and management service by telephone and the availability of in person appointments. I also discussed with the patient that there may be a patient responsible charge related to this service. The patient expressed understanding and agreed to proceed.  Subjective:  Patient ID: Eugene Love, male    DOB: 1986/02/22, 35 y.o.   MRN: [id number]  Chief Complaint:  Fever, Cough, and URI   HPI: Eugene Love is a 35 y.o. 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  States he now has headache, chills, and diarrhea.  No nausea or vomiting.  Several students are out with similar symptoms.  He has been taking Tylenol and over-the-counter cold medications with minimal relief of symptoms.  Fever  This is a new problem. The current episode started yesterday. The problem occurs constantly. The maximum temperature noted was 101 to 101.9 F. The temperature was taken using an oral thermometer. Associated symptoms include congestion, coughing,  diarrhea, headaches and muscle aches. Pertinent negatives include no abdominal pain, chest pain, ear pain, nausea, rash, sleepiness, sore throat, urinary pain, vomiting or wheezing. He has tried acetaminophen and fluids for the symptoms. The treatment provided mild relief.  Risk factors: sick contacts   Cough This is a new problem. The current episode started yesterday. The cough is Non-productive. Associated symptoms include chills, a fever, headaches, myalgias, nasal congestion, postnasal drip and rhinorrhea. Pertinent negatives include no chest pain, ear congestion, ear pain, heartburn, hemoptysis, rash, sore throat, shortness of breath, sweats, weight loss or wheezing. He has tried OTC cough suppressant for the symptoms. The treatment provided mild relief.  URI  This is a new problem. The current episode started yesterday. The maximum temperature recorded prior to his arrival was 101 - 101.9 F. Associated symptoms include congestion, coughing, diarrhea, headaches and rhinorrhea. Pertinent negatives include no abdominal pain, chest pain, dysuria, ear pain, joint pain, joint swelling, nausea, plugged ear sensation, rash, sinus pain, sneezing, sore throat, swollen glands, vomiting or wheezing. He has tried decongestant and acetaminophen for the symptoms. The treatment provided mild relief.    Relevant past medical, surgical, family, and social history reviewed and updated as indicated.  Allergies and medications reviewed and updated.   Past Medical History:  Diagnosis Date   Cellulitis    inner thigh    History of kidney stones    History of right flank pain    Hypertension     Past Surgical History:  Procedure Laterality Date   CYSTOSCOPY WITH RETROGRADE PYELOGRAM, URETEROSCOPY AND STENT PLACEMENT Right 01/06/2017   Procedure: CYSTOSCOPY WITH RIGHT RETROGRADE PYELOGRAM, URETEROSCOPY WITH  STONE REMOVAL;  Surgeon: Heloise Purpura, MD;  Location: [facility];  Service: Urology;  Laterality: Right;    HOLMIUM LASER APPLICATION Right 01/06/2017   Procedure: HOLMIUM LASER APPLICATION;  Surgeon: Heloise Purpura, MD;  Location: [facility];  Service: Urology;  Laterality: Right;   KNEE ARTHROTOMY Right    URETHRAL DILATION      Social History   Socioeconomic History   Marital status: Married    Spouse name: Scientist, research (medical)   Number of children: 3   Years of education: Not on file   Highest education level: Not on file  Occupational History   Not on file  Tobacco Use   Smoking status: Never   Smokeless tobacco: Current    Types: Snuff   Tobacco comments:    chews tobacco  Vaping Use   Vaping Use: Never used  Substance and Sexual Activity   Alcohol use: Yes    Comment: 6 beers over the last month    Drug use: No   Sexual activity: Not on file  Other Topics Concern   Not on file  Social History Narrative   Not on file   Social Determinants of Health   Financial Resource Strain: Not on file  Food Insecurity: Not on file  Transportation Needs: Not on file  Physical Activity: Not on file  Stress: Not on file  Social Connections: Not on file  Intimate Partner Violence: Not on file    Outpatient Encounter Medications as of 07/06/2021  Medication Sig   amLODipine (NORVASC) 5 MG tablet Take 1 tablet (5 mg total) by mouth daily.   atorvastatin (LIPITOR) 20 MG tablet TAKE ONE (1) TABLET EACH DAY   busPIRone (BUSPAR) 5 MG tablet TAKE ONE TABLET 3 TIMES A DAY AS NEEDED.   hydrochlorothiazide (HYDRODIURIL) 12.5 MG tablet Take 1 tablet (12.5 mg total) by mouth daily.   losartan (COZAAR) 100 MG tablet Take 1 tablet (100 mg total) by mouth daily.   metoprolol succinate (TOPROL-XL) 50 MG 24 hr tablet TAKE 1 TABLET DAILY WITH FOOD   Multiple Vitamins-Minerals (MULTIVITAMIN WITH MINERALS) tablet Take 1 tablet by mouth daily.   valACYclovir (VALTREX) 1000 MG tablet Take 1 tablet (1,000 mg total) by mouth daily.   No facility-administered encounter medications on file as of 07/06/2021.     Allergies  Allergen Reactions   Lisinopril     cough    Review of Systems  Constitutional:  Positive for activity change, appetite change, chills, fatigue and fever. Negative for diaphoresis, unexpected weight change and weight loss.  HENT:  Positive for congestion, postnasal drip and rhinorrhea. Negative for ear pain, sinus pain, sneezing and sore throat.   Respiratory:  Positive for cough. Negative for hemoptysis, shortness of breath and wheezing.   Cardiovascular:  Negative for chest pain.  Gastrointestinal:  Positive for diarrhea. Negative for abdominal pain, heartburn, nausea and vomiting.  Genitourinary:  Negative for decreased urine volume and dysuria.  Musculoskeletal:  Positive for myalgias. Negative for joint pain.  Skin:  Negative for rash.  Neurological:  Positive for headaches. Negative for dizziness, tremors, seizures, syncope, facial asymmetry, speech difficulty, weakness, light-headedness and numbness.  Psychiatric/Behavioral:  Negative for confusion.   All other systems reviewed and are negative.       Observations/Objective: No vital signs or physical exam, this was a telephone or virtual health encounter.  Pt alert and oriented, answers all questions appropriately, and able to speak in full sentences.    Assessment and Plan: Antonio was seen today  for fever, cough and uri.  Diagnoses and all orders for this visit:  URI with cough and congestion Fever and chills Concerning for influenza or COVID.  RSV is prominent and area also.  We will swab for all 3.  Will initiate antivirals if warranted.  Symptomatic care discussed in detail.  Patient aware of adequate hydration and to report any new, worsening, or persistent symptoms.  Follow-up as needed.  Patient instructed to come to office for testing. -     COVID-19, Flu A+B and RSV     Follow Up Instructions: Return if symptoms worsen or fail to improve.    I discussed the assessment and treatment plan with  the patient. The patient was provided an opportunity to ask questions and all were answered. The patient agreed with the plan and demonstrated an understanding of the instructions.   The patient was advised to call back or seek an in-person evaluation if the symptoms worsen or if the condition fails to improve as anticipated.  The above assessment and management plan was discussed with the patient. The patient verbalized understanding of and has agreed to the management plan. Patient is aware to call the clinic if they develop any new symptoms or if symptoms persist or worsen. Patient is aware when to return to the clinic for a follow-up visit. Patient educated on when it is appropriate to go to the emergency department.    I provided 12 minutes of non-face-to-face time during this encounter. The call started at 1000. The call ended at 1010. The other time was used for coordination of care.    Kari Baars, FNP-C [facility] [address] [place], [place] [address] [phone] 07/06/2021

## 2021-07-07 LAB — COVID-19, FLU A+B AND RSV
Influenza A, NAA: DETECTED — AB
Influenza B, NAA: NOT DETECTED
RSV, NAA: NOT DETECTED
SARS-CoV-2, NAA: NOT DETECTED

## 2021-07-07 MED ORDER — OSELTAMIVIR PHOSPHATE 75 MG PO CAPS: 75.0000 mg | ORAL_CAPSULE | Freq: Two times a day (BID) | ORAL | 0 refills | Status: AC

## 2021-07-07 NOTE — Addendum Note (Signed)
Addended by: Sonny Masters on: 07/07/2021 09:00 PM   Modules accepted: Orders

## 2021-07-08 ENCOUNTER — Encounter: Payer: Self-pay | Admitting: Family Medicine

## 2021-07-09 ENCOUNTER — Other Ambulatory Visit: Payer: Self-pay | Admitting: Family Medicine

## 2021-07-09 DIAGNOSIS — R051 Acute cough: Secondary | ICD-10-CM

## 2021-07-09 MED ORDER — BENZONATATE 100 MG PO CAPS: 100.0000 mg | ORAL_CAPSULE | Freq: Three times a day (TID) | ORAL | 0 refills | Status: DC | PRN

## 2021-07-10 ENCOUNTER — Encounter: Payer: Self-pay | Admitting: Family Medicine

## 2021-07-10 ENCOUNTER — Ambulatory Visit (INDEPENDENT_AMBULATORY_CARE_PROVIDER_SITE_OTHER): Payer: BC Managed Care – PPO | Admitting: Family Medicine

## 2021-07-10 VITALS — BP 139/92 | HR 91 | Temp 98.1°F

## 2021-07-10 DIAGNOSIS — J40 Bronchitis, not specified as acute or chronic: Secondary | ICD-10-CM | POA: Diagnosis not present

## 2021-07-10 MED ORDER — BETAMETHASONE SOD PHOS & ACET 6 (3-3) MG/ML IJ SUSP
6.0000 mg | Freq: Once | INTRAMUSCULAR | Status: AC
Start: 2021-07-10 — End: 2021-07-10
  Administered 2021-07-10: 6 mg via INTRAMUSCULAR

## 2021-07-10 MED ORDER — CHERATUSSIN AC 100-10 MG/5ML PO SOLN: 5.0000 mL | ORAL | 0 refills | Status: DC | PRN

## 2021-07-10 NOTE — Progress Notes (Signed)
Subjective:  Patient ID: Eugene Love, male    DOB: 11-06-1985  Age: 35 y.o. MRN: [id number]  CC: Cough, Fever, Chills, Nasal Congestion, and Wheezing   HPI Eugene Love presents for flu last week. Getting better so he gave his Tamiflu to his wife. Wheezing now. Intermittent dyspnea. Weak. A couple of cough spells made him light headed. Also having a dry cough. No smoking or asthma history. Had fever at onset. None for last 3 days.   Depression screen [facility] 2/9 03/27/2021 02/21/2021 12/13/2020  Decreased Interest 0 0 0  Down, Depressed, Hopeless 0 0 0  PHQ - 2 Score 0 0 0  Altered sleeping - - 0  Tired, decreased energy - - 0  Change in appetite - - 0  Feeling bad or failure about yourself  - - 0  Trouble concentrating - - 0  Moving slowly or fidgety/restless - - 0  Suicidal thoughts - - 0  PHQ-9 Score - - 0    History Eugene Love has a past medical history of Cellulitis, History of kidney stones, History of right flank pain, and Hypertension.   He has a past surgical history that includes Knee Arthrotomy (Right); Urethral dilation; Cystoscopy with retrograde pyelogram, ureteroscopy and stent placement (Right, 01/06/2017); and Holmium laser application (Right, [id number]).   His family history includes Crohn's disease in his father.He reports that he has never smoked. His smokeless tobacco use includes snuff. He reports current alcohol use. He reports that he does not use drugs.    ROS Review of Systems  Constitutional:  Positive for fatigue and fever.  HENT:  Positive for congestion.   Respiratory:  Positive for shortness of breath and wheezing.   Cardiovascular:  Negative for chest pain.  Musculoskeletal:  Negative for arthralgias.  Skin:  Negative for rash.   Objective:  BP (!) 139/92   Pulse 91   Temp 98.1 F (36.7 C)   SpO2 99%   BP Readings from Last 3 Encounters:  07/10/21 (!) 139/92  03/27/21 132/88  02/21/21 135/84    Wt Readings from Last 3 Encounters:  03/27/21  292 lb 6.4 oz (132.6 kg)  02/21/21 287 lb 6.4 oz (130.4 kg)  12/13/20 287 lb (130.2 kg)     Physical Exam Vitals reviewed.  Constitutional:      Appearance: He is well-developed.  HENT:     Head: Normocephalic and atraumatic.     Right Ear: External ear normal.     Left Ear: External ear normal.     Mouth/Throat:     Pharynx: No oropharyngeal exudate or posterior oropharyngeal erythema.  Eyes:     Pupils: Pupils are equal, round, and reactive to light.  Cardiovascular:     Rate and Rhythm: Normal rate and regular rhythm.     Heart sounds: No murmur heard. Pulmonary:     Effort: No respiratory distress.     Breath sounds: Wheezing (right lower lobe posteriorly) present.  Musculoskeletal:     Cervical back: Normal range of motion and neck supple.  Neurological:     Mental Status: He is alert and oriented to person, place, and time.      Assessment & Plan:   Eugene was seen today for cough, fever, chills, nasal congestion and wheezing.  Diagnoses and all orders for this visit:  Bronchitis -     betamethasone acetate-betamethasone sodium phosphate (CELESTONE) injection 6 mg  Other orders -     guaiFENesin-codeine (CHERATUSSIN AC) 100-10 MG/5ML syrup; Take 5  mLs by mouth every 4 (four) hours as needed for cough.      I have discontinued Eugene Love's benzonatate. I am also having him start on Cheratussin AC. Additionally, I am having him maintain his multivitamin with minerals, losartan, valACYclovir, atorvastatin, amLODipine, hydrochlorothiazide, busPIRone, metoprolol succinate, and oseltamivir. We administered betamethasone acetate-betamethasone sodium phosphate.  Allergies as of 07/10/2021       Reactions   Lisinopril    cough        Medication List        Accurate as of July 10, 2021  6:30 PM. If you have any questions, ask your nurse or doctor.          STOP taking these medications    benzonatate 100 MG capsule Commonly known as: Network engineer Stopped by: Mechele Claude, MD       TAKE these medications    amLODipine 5 MG tablet Commonly known as: NORVASC Take 1 tablet (5 mg total) by mouth daily.   atorvastatin 20 MG tablet Commonly known as: LIPITOR TAKE ONE (1) TABLET EACH DAY   busPIRone 5 MG tablet Commonly known as: BUSPAR TAKE ONE TABLET 3 TIMES A DAY AS NEEDED.   Cheratussin AC 100-10 MG/5ML syrup Generic drug: guaiFENesin-codeine Take 5 mLs by mouth every 4 (four) hours as needed for cough. Started by: Mechele Claude, MD   hydrochlorothiazide 12.5 MG tablet Commonly known as: HYDRODIURIL Take 1 tablet (12.5 mg total) by mouth daily.   losartan 100 MG tablet Commonly known as: COZAAR Take 1 tablet (100 mg total) by mouth daily.   metoprolol succinate 50 MG 24 hr tablet Commonly known as: TOPROL-XL TAKE 1 TABLET DAILY WITH FOOD   multivitamin with minerals tablet Take 1 tablet by mouth daily.   oseltamivir 75 MG capsule Commonly known as: Tamiflu Take 1 capsule (75 mg total) by mouth 2 (two) times daily for 5 days.   valACYclovir 1000 MG tablet Commonly known as: VALTREX Take 1 tablet (1,000 mg total) by mouth daily.         Follow-up: Return if symptoms worsen or fail to improve.  Mechele Claude, M.D.

## 2021-07-20 ENCOUNTER — Other Ambulatory Visit: Payer: Self-pay | Admitting: Family

## 2021-07-20 DIAGNOSIS — I10 Essential (primary) hypertension: Secondary | ICD-10-CM

## 2021-07-20 DIAGNOSIS — E785 Hyperlipidemia, unspecified: Secondary | ICD-10-CM

## 2021-09-04 ENCOUNTER — Other Ambulatory Visit: Payer: Self-pay | Admitting: Family

## 2021-09-04 DIAGNOSIS — I10 Essential (primary) hypertension: Secondary | ICD-10-CM

## 2021-09-24 ENCOUNTER — Encounter: Payer: Self-pay | Admitting: Family

## 2021-09-24 ENCOUNTER — Ambulatory Visit: Payer: BC Managed Care – PPO | Admitting: Family

## 2021-09-24 VITALS — BP 123/78 | HR 71 | Temp 97.9°F | Ht 71.0 in | Wt 279.2 lb

## 2021-09-24 DIAGNOSIS — I10 Essential (primary) hypertension: Secondary | ICD-10-CM

## 2021-09-24 DIAGNOSIS — F411 Generalized anxiety disorder: Secondary | ICD-10-CM

## 2021-09-24 DIAGNOSIS — B009 Herpesviral infection, unspecified: Secondary | ICD-10-CM | POA: Diagnosis not present

## 2021-09-24 DIAGNOSIS — E785 Hyperlipidemia, unspecified: Secondary | ICD-10-CM

## 2021-09-24 DIAGNOSIS — F5101 Primary insomnia: Secondary | ICD-10-CM

## 2021-09-24 DIAGNOSIS — J029 Acute pharyngitis, unspecified: Secondary | ICD-10-CM | POA: Diagnosis not present

## 2021-09-24 DIAGNOSIS — Z20818 Contact with and (suspected) exposure to other bacterial communicable diseases: Secondary | ICD-10-CM

## 2021-09-24 LAB — RAPID STREP SCREEN (MED CTR MEBANE ONLY): Strep Gp A Ag, IA W/Reflex: NEGATIVE

## 2021-09-24 LAB — CULTURE, GROUP A STREP

## 2021-09-24 MED ORDER — AMOXICILLIN 500 MG PO CAPS: 500.0000 mg | ORAL_CAPSULE | Freq: Two times a day (BID) | ORAL | 0 refills | Status: AC

## 2021-09-24 NOTE — Patient Instructions (Signed)

## 2021-09-24 NOTE — Progress Notes (Signed)
Subjective:    Patient ID: Eugene Love, male    DOB: 12-09-1985, 36 y.o.   MRN: [id number]  Chief Complaint  Patient presents with   Medical Management of Chronic Issues   Sore Throat    Son tested for strep   PT presents to the office today for chronic follow up.  State he has a mild sore throat and his son tested positive for strep last week. He has herpes and takes valtrex as needed for outbreaks. Reports his last outbreak was over two years ago.  Sore Throat  This is a new problem. The current episode started 1 to 4 weeks ago. The problem has been waxing and waning. There has been no fever. The pain is at a severity of 1/10. The pain is mild. Associated symptoms include coughing. Pertinent negatives include no congestion, ear pain, headaches, shortness of breath or trouble swallowing.  Hypertension This is a chronic problem. The current episode started more than 1 year ago. The problem has been resolved since onset. The problem is controlled. Associated symptoms include anxiety. Pertinent negatives include no headaches, malaise/fatigue, peripheral edema or shortness of breath. Risk factors for coronary artery disease include dyslipidemia, obesity and male gender.  Hyperlipidemia This is a chronic problem. The current episode started more than 1 year ago. Exacerbating diseases include obesity. Pertinent negatives include no shortness of breath. Current antihyperlipidemic treatment includes statins. The current treatment provides moderate improvement of lipids. Risk factors for coronary artery disease include dyslipidemia, male sex, hypertension and a sedentary lifestyle.  Insomnia Primary symptoms: difficulty falling asleep, frequent awakening, no malaise/fatigue.    Anxiety Presents for follow-up visit. Symptoms include excessive worry, insomnia, irritability and nervous/anxious behavior. Patient reports no shortness of breath. Symptoms occur occasionally.       Review of Systems   Constitutional:  Positive for irritability. Negative for malaise/fatigue.  HENT:  Negative for congestion, ear pain and trouble swallowing.   Respiratory:  Positive for cough. Negative for shortness of breath.   Neurological:  Negative for headaches.  Psychiatric/Behavioral:  The patient is nervous/anxious and has insomnia.   All other systems reviewed and are negative.     Objective:   Physical Exam Vitals reviewed.  Constitutional:      General: He is not in acute distress.    Appearance: He is well-developed. He is obese.  HENT:     Head: Normocephalic.     Right Ear: Tympanic membrane and external ear normal.     Left Ear: Tympanic membrane and external ear normal.  Eyes:     General:        Right eye: No discharge.        Left eye: No discharge.     Pupils: Pupils are equal, round, and reactive to light.  Neck:     Thyroid: No thyromegaly.  Cardiovascular:     Rate and Rhythm: Normal rate and regular rhythm.     Heart sounds: Normal heart sounds. No murmur heard. Pulmonary:     Effort: Pulmonary effort is normal. No respiratory distress.     Breath sounds: Normal breath sounds. No wheezing.  Abdominal:     General: Bowel sounds are normal. There is no distension.     Palpations: Abdomen is soft.     Tenderness: There is no abdominal tenderness.  Musculoskeletal:        General: No tenderness. Normal range of motion.     Cervical back: Normal range of motion and neck supple.  Skin:    General: Skin is warm and dry.     Findings: No erythema or rash.  Neurological:     Mental Status: He is alert and oriented to person, place, and time.     Cranial Nerves: No cranial nerve deficit.     Deep Tendon Reflexes: Reflexes are normal and symmetric.  Psychiatric:        Behavior: Behavior normal.        Thought Content: Thought content normal.        Judgment: Judgment normal.         BP 123/78    Pulse 71    Temp 97.9 F (36.6 C) (Temporal)    Ht _0  (1.803 m)     Wt 279 lb 3.2 oz (126.6 kg)    BMI 38.94 kg/m   Assessment & Plan:  Eugene Love comes in today with chief complaint of Medical Management of Chronic Issues and Sore Throat (Son tested for strep)   Diagnosis and orders addressed:  1. Sore throat - Rapid Strep Screen ([facility] ONLY) - CMP14+EGFR - CBC with Differential/Platelet - amoxicillin (AMOXIL) 500 MG capsule; Take 1 capsule (500 mg total) by mouth 2 (two) times daily for 10 days.  Dispense: 20 capsule; Refill: 0  2. Essential hypertension - CMP14+EGFR - CBC with Differential/Platelet  3. GAD (generalized anxiety disorder) - CMP14+EGFR - CBC with Differential/Platelet  4. Herpes - CMP14+EGFR - CBC with Differential/Platelet  5. Hyperlipidemia, unspecified hyperlipidemia type - CMP14+EGFR - CBC with Differential/Platelet  6. Primary insomnia - CMP14+EGFR - CBC with Differential/Platelet  7. Exposure to strep throat - amoxicillin (AMOXIL) 500 MG capsule; Take 1 capsule (500 mg total) by mouth 2 (two) times daily for 10 days.  Dispense: 20 capsule; Refill: 0   Labs pending Health Maintenance reviewed Diet and exercise encouraged  Follow up plan: 6 months   Evelina Dun, FNP

## 2021-10-08 ENCOUNTER — Telehealth: Payer: BC Managed Care – PPO | Admitting: Family Medicine

## 2021-10-08 DIAGNOSIS — J069 Acute upper respiratory infection, unspecified: Secondary | ICD-10-CM | POA: Diagnosis not present

## 2021-10-08 DIAGNOSIS — H1033 Unspecified acute conjunctivitis, bilateral: Secondary | ICD-10-CM | POA: Diagnosis not present

## 2021-10-08 MED ORDER — OLOPATADINE HCL 0.1 % OP SOLN: 1.0000 [drp] | Freq: Two times a day (BID) | OPHTHALMIC | 0 refills | Status: DC | PRN

## 2021-10-08 NOTE — Progress Notes (Signed)
MyChart Video visit  Subjective: CC:red eyes PCP: Sharion Balloon, FNP [phone] Eugene Love is a 36 y.o. male. Patient provides verbal consent for consult held via video.  Due to COVID-19 pandemic this visit was conducted virtually. This visit type was conducted due to national recommendations for restrictions regarding the COVID-19 Pandemic (e.g. social distancing, sheltering in place) in an effort to limit this patient's exposure and mitigate transmission in our community. All issues noted in this document were discussed and addressed.  A physical exam was not performed with this format.   Location of patient: work Location of provider: [facility] Others present for call: students  1. Red eyes  Patient reports onset of red eye, cough, congestion, sore throat last week.  Reports matting of the eyelashes.  Took some dayquil.  Saw school nurse Friday pm.  He was ill 09/24/2021.  Was prescribed Amoxicillin for strep but just started taking it yesterday.  Not tested for COVID, flu.  His eyes really are what bother him the most.  No active drainage but sometimes his vision seems a little blurry.  Overall the upper respiratory symptoms seem to be relatively well controlled with over-the-counter medications   ROS: Per HPI  Allergies  Allergen Reactions   Lisinopril     cough   Past Medical History:  Diagnosis Date   Cellulitis    inner thigh    History of kidney stones    History of right flank pain    Hypertension     Current Outpatient Medications:    amLODipine (NORVASC) 5 MG tablet, TAKE ONE (1) TABLET EACH DAY, Disp: 90 tablet, Rfl: 0   atorvastatin (LIPITOR) 20 MG tablet, TAKE ONE (1) TABLET EACH DAY, Disp: 30 tablet, Rfl: 2   busPIRone (BUSPAR) 5 MG tablet, TAKE ONE TABLET 3 TIMES A DAY AS NEEDED., Disp: 90 tablet, Rfl: 3   guaiFENesin-codeine (CHERATUSSIN AC) 100-10 MG/5ML syrup, Take 5 mLs by mouth every 4 (four) hours as needed for cough., Disp: 180 mL, Rfl: 0   hydrochlorothiazide  (HYDRODIURIL) 12.5 MG tablet, Take 1 tablet (12.5 mg total) by mouth daily., Disp: 90 tablet, Rfl: 3   losartan (COZAAR) 100 MG tablet, Take 1 tablet (100 mg total) by mouth daily., Disp: 90 tablet, Rfl: 3   metoprolol succinate (TOPROL-XL) 50 MG 24 hr tablet, TAKE 1 TABLET DAILY WITH FOOD, Disp: 90 tablet, Rfl: 0   Multiple Vitamins-Minerals (MULTIVITAMIN WITH MINERALS) tablet, Take 1 tablet by mouth daily., Disp: , Rfl:    valACYclovir (VALTREX) 1000 MG tablet, Take 1 tablet (1,000 mg total) by mouth daily., Disp: 90 tablet, Rfl: 3  Assessment/ Plan: 36 y.o. male   Acute conjunctivitis of both eyes, unspecified acute conjunctivitis type - Plan: olopatadine (PATANOL) 0.1 % ophthalmic solution  URI with cough and congestion  Going to give him an antihistamine eyedrop to help with the conjunctivitis is described.  I suspect this is likely viral versus allergic conjunctivitis.  Nothing from his report sound bacterial.  He seems to be suffering from some type of upper respiratory virus at this point but started an antibiotic that was prescribed for strep a couple of weeks ago.  I think it is fine for him to continue the amoxicillin if he finds it to be helpful.  May continue OTC remedies as well.  Consider COVID testing at home.  Follow-up as needed  Start time: 1:32pm End time: 1:39pm  Total time spent on patient care (including video visit/ documentation): 6.5 minutes  Lakeva Hollon M  Lajuana Ripple, [place] [phone]

## 2021-10-30 ENCOUNTER — Ambulatory Visit (INDEPENDENT_AMBULATORY_CARE_PROVIDER_SITE_OTHER): Payer: BC Managed Care – PPO | Admitting: Nurse Practitioner

## 2021-10-30 ENCOUNTER — Encounter: Payer: Self-pay | Admitting: Nurse Practitioner

## 2021-10-30 DIAGNOSIS — J069 Acute upper respiratory infection, unspecified: Secondary | ICD-10-CM

## 2021-10-30 MED ORDER — BENZONATATE 100 MG PO CAPS: 100.0000 mg | ORAL_CAPSULE | Freq: Three times a day (TID) | ORAL | 0 refills | Status: DC | PRN

## 2021-10-30 MED ORDER — LORATADINE 10 MG PO TABS: 10.0000 mg | ORAL_TABLET | Freq: Every day | ORAL | 11 refills | Status: DC

## 2021-10-30 MED ORDER — PREDNISONE 20 MG PO TABS: 40.0000 mg | ORAL_TABLET | Freq: Every day | ORAL | 0 refills | Status: AC

## 2021-10-30 MED ORDER — FLUTICASONE PROPIONATE 50 MCG/ACT NA SUSP: 2.0000 | Freq: Every day | NASAL | 6 refills | Status: DC

## 2021-10-30 NOTE — Progress Notes (Signed)
? ?Virtual Visit  Note ?Due to COVID-19 pandemic this visit was conducted virtually. This visit type was conducted due to national recommendations for restrictions regarding the COVID-19 Pandemic (e.g. social distancing, sheltering in place) in an effort to limit this patient's exposure and mitigate transmission in our community. All issues noted in this document were discussed and addressed.  A physical exam was not performed with this format. ? ?I connected with [name] on 10/30/21 at 4:00 by telephone and verified that I am speaking with the correct person using two identifiers. Eugene Love is currently located at home and no one is currently with him during visit. The provider, Mary-Margaret Daphine Deutscher, FNP is located in their office at time of visit. ? ?I discussed the limitations, risks, security and privacy concerns of performing an evaluation and management service by telephone and the availability of in person appointments. I also discussed with the patient that there may be a patient responsible charge related to this service. The patient expressed understanding and agreed to proceed. ? ? ?History and Present Illness: ? ?URI  ?This is a new problem. The current episode started in the past 7 days (Saturday morning). The problem has been gradually worsening. The maximum temperature recorded prior to his arrival was 100.4 - 100.9 F. The fever has been present for Less than 1 day. Associated symptoms include congestion, coughing, headaches, rhinorrhea and a sore throat. He has tried decongestant for the symptoms. The treatment provided mild relief. Wheezing at night. ? ? ? ?Review of Systems  ?HENT:  Positive for congestion, rhinorrhea and sore throat.   ?Respiratory:  Positive for cough.   ?Neurological:  Positive for headaches.  ? ? ?Observations/Objective: ?Alert and oriented- answers all questions appropriately ?No distress ?Dry cough noted ?Raspy voice ? ?Assessment and Plan: ?Kade Demicco in today  with chief complaint of URI ? ? ?1. URI with cough and congestion ?1. Take meds as prescribed ?2. Use a cool mist humidifier especially during the winter months and when heat has been humid. ?3. Use saline nose sprays frequently ?4. Saline irrigations of the nose can be very helpful if done frequently. ? * 4X daily for 1 week* ? * Use of a nettie pot can be helpful with this. Follow directions with this* ?5. Drink plenty of fluids ?6. Keep thermostat turn down low ?7.For any cough or congestion- tessalon perles ?8. For fever or aces or pains- take tylenol or ibuprofen appropriate for age and weight. ? * for fevers greater than 101 orally you may alternate ibuprofen and tylenol every  3 hours. ?  ? ?Meds ordered this encounter  ?Medications  ? fluticasone (FLONASE) 50 MCG/ACT nasal spray  ?  Sig: Place 2 sprays into both nostrils daily.  ?  Dispense:  16 g  ?  Refill:  6  ?  Order Specific Question:   Supervising Provider  ?  Answer:   Arville Care A [[id number]]  ? benzonatate (TESSALON PERLES) 100 MG capsule  ?  Sig: Take 1 capsule (100 mg total) by mouth 3 (three) times daily as needed for cough.  ?  Dispense:  20 capsule  ?  Refill:  0  ?  Order Specific Question:   Supervising Provider  ?  Answer:   Arville Care A [[id number]]  ? loratadine (CLARITIN) 10 MG tablet  ?  Sig: Take 1 tablet (10 mg total) by mouth daily.  ?  Dispense:  30 tablet  ?  Refill:  11  ?  Order Specific Question:   Supervising Provider  ?  Answer:   Arville Care A [[id number]]  ? predniSONE (DELTASONE) 20 MG tablet  ?  Sig: Take 2 tablets (40 mg total) by mouth daily with breakfast for 5 days. 2 po daily for 5 days  ?  Dispense:  10 tablet  ?  Refill:  0  ?  Order Specific Question:   Supervising Provider  ?  Answer:   Arville Care A [[id number]]  ? ? ? ? ? ?Follow Up Instructions: ?prn ? ?  ?I discussed the assessment and treatment plan with the patient. The patient was provided an opportunity to ask questions and all were  answered. The patient agreed with the plan and demonstrated an understanding of the instructions. ?  ?The patient was advised to call back or seek an in-person evaluation if the symptoms worsen or if the condition fails to improve as anticipated. ? ?The above assessment and management plan was discussed with the patient. The patient verbalized understanding of and has agreed to the management plan. Patient is aware to call the clinic if symptoms persist or worsen. Patient is aware when to return to the clinic for a follow-up visit. Patient educated on when it is appropriate to go to the emergency department.  ? ?Time call ended:  4:12 ? ?I provided 12 minutes of  non face-to-face time during this encounter. ? ? ? ?Mary-Margaret Daphine Deutscher, FNP ? ? ?

## 2021-10-30 NOTE — Patient Instructions (Signed)

## 2021-12-20 ENCOUNTER — Other Ambulatory Visit: Payer: Self-pay | Admitting: Family

## 2021-12-20 DIAGNOSIS — E785 Hyperlipidemia, unspecified: Secondary | ICD-10-CM

## 2021-12-20 DIAGNOSIS — I10 Essential (primary) hypertension: Secondary | ICD-10-CM

## 2022-03-25 ENCOUNTER — Ambulatory Visit: Payer: BC Managed Care – PPO | Admitting: Family

## 2022-03-25 ENCOUNTER — Encounter: Payer: Self-pay | Admitting: Family

## 2022-03-25 VITALS — BP 129/85 | HR 81 | Temp 98.3°F | Ht 71.0 in | Wt 287.2 lb

## 2022-03-25 DIAGNOSIS — F411 Generalized anxiety disorder: Secondary | ICD-10-CM

## 2022-03-25 DIAGNOSIS — I10 Essential (primary) hypertension: Secondary | ICD-10-CM

## 2022-03-25 DIAGNOSIS — Z0000 Encounter for general adult medical examination without abnormal findings: Secondary | ICD-10-CM

## 2022-03-25 DIAGNOSIS — F5101 Primary insomnia: Secondary | ICD-10-CM

## 2022-03-25 DIAGNOSIS — E785 Hyperlipidemia, unspecified: Secondary | ICD-10-CM | POA: Diagnosis not present

## 2022-03-25 DIAGNOSIS — H6121 Impacted cerumen, right ear: Secondary | ICD-10-CM

## 2022-03-25 DIAGNOSIS — B009 Herpesviral infection, unspecified: Secondary | ICD-10-CM | POA: Diagnosis not present

## 2022-03-25 DIAGNOSIS — Z0001 Encounter for general adult medical examination with abnormal findings: Secondary | ICD-10-CM

## 2022-03-25 MED ORDER — LOSARTAN POTASSIUM 100 MG PO TABS: ORAL_TABLET | ORAL | 1 refills | Status: DC

## 2022-03-25 MED ORDER — AMLODIPINE BESYLATE 5 MG PO TABS: ORAL_TABLET | ORAL | 1 refills | Status: DC

## 2022-03-25 MED ORDER — ATORVASTATIN CALCIUM 20 MG PO TABS: 20.0000 mg | ORAL_TABLET | Freq: Every day | ORAL | 3 refills | Status: DC

## 2022-03-25 MED ORDER — HYDROCHLOROTHIAZIDE 12.5 MG PO CAPS: 12.5000 mg | ORAL_CAPSULE | Freq: Every day | ORAL | 1 refills | Status: DC

## 2022-03-25 MED ORDER — BUSPIRONE HCL 5 MG PO TABS: ORAL_TABLET | ORAL | 3 refills | Status: DC

## 2022-03-25 MED ORDER — VALACYCLOVIR HCL 1 G PO TABS: 1000.0000 mg | ORAL_TABLET | Freq: Every day | ORAL | 3 refills | Status: DC

## 2022-03-25 MED ORDER — METOPROLOL SUCCINATE ER 50 MG PO TB24: 50.0000 mg | ORAL_TABLET | Freq: Every day | ORAL | 1 refills | Status: DC

## 2022-03-25 NOTE — Patient Instructions (Signed)
Health Maintenance, Male Adopting a healthy lifestyle and getting preventive care are important in promoting health and wellness. Ask your health care provider about: The right schedule for you to have regular tests and exams. Things you can do on your own to prevent diseases and keep yourself healthy. What should I know about diet, weight, and exercise? Eat a healthy diet  Eat a diet that includes plenty of vegetables, fruits, low-fat dairy products, and lean protein. Do not eat a lot of foods that are high in solid fats, added sugars, or sodium. Maintain a healthy weight Body mass index (BMI) is a measurement that can be used to identify possible weight problems. It estimates body fat based on height and weight. Your health care provider can help determine your BMI and help you achieve or maintain a healthy weight. Get regular exercise Get regular exercise. This is one of the most important things you can do for your health. Most adults should: Exercise for at least 150 minutes each week. The exercise should increase your heart rate and make you sweat (moderate-intensity exercise). Do strengthening exercises at least twice a week. This is in addition to the moderate-intensity exercise. Spend less time sitting. Even light physical activity can be beneficial. Watch cholesterol and blood lipids Have your blood tested for lipids and cholesterol at 34 years of age, then have this test every 5 years. You may need to have your cholesterol levels checked more often if: Your lipid or cholesterol levels are high. You are older than 34 years of age. You are at high risk for heart disease. What should I know about cancer screening? Many types of cancers can be detected early and may often be prevented. Depending on your health history and family history, you may need to have cancer screening at various ages. This may include screening for: Colorectal cancer. Prostate cancer. Skin cancer. Lung  cancer. What should I know about heart disease, diabetes, and high blood pressure? Blood pressure and heart disease High blood pressure causes heart disease and increases the risk of stroke. This is more likely to develop in people who have high blood pressure readings or are overweight. Talk with your health care provider about your target blood pressure readings. Have your blood pressure checked: Every 3-5 years if you are 18-39 years of age. Every year if you are 40 years old or older. If you are between the ages of 65 and 75 and are a current or former smoker, ask your health care provider if you should have a one-time screening for abdominal aortic aneurysm (AAA). Diabetes Have regular diabetes screenings. This checks your fasting blood sugar level. Have the screening done: Once every three years after age 45 if you are at a normal weight and have a low risk for diabetes. More often and at a younger age if you are overweight or have a high risk for diabetes. What should I know about preventing infection? Hepatitis B If you have a higher risk for hepatitis B, you should be screened for this virus. Talk with your health care provider to find out if you are at risk for hepatitis B infection. Hepatitis C Blood testing is recommended for: Everyone born from 1945 through 1965. Anyone with known risk factors for hepatitis C. Sexually transmitted infections (STIs) You should be screened each year for STIs, including gonorrhea and chlamydia, if: You are sexually active and are younger than 34 years of age. You are older than 34 years of age and your   health care provider tells you that you are at risk for this type of infection. Your sexual activity has changed since you were last screened, and you are at increased risk for chlamydia or gonorrhea. Ask your health care provider if you are at risk. Ask your health care provider about whether you are at high risk for HIV. Your health care provider  may recommend a prescription medicine to help prevent HIV infection. If you choose to take medicine to prevent HIV, you should first get tested for HIV. You should then be tested every 3 months for as long as you are taking the medicine. Follow these instructions at home: Alcohol use Do not drink alcohol if your health care provider tells you not to drink. If you drink alcohol: Limit how much you have to 0-2 drinks a day. Know how much alcohol is in your drink. In the [place] one drink equals one 12 oz bottle of beer (355 mL), one 5 oz glass of wine (148 mL), or one 1 oz glass of hard liquor (44 mL). Lifestyle Do not use any products that contain nicotine or tobacco. These products include cigarettes, chewing tobacco, and vaping devices, such as e-cigarettes. If you need help quitting, ask your health care provider. Do not use street drugs. Do not share needles. Ask your health care provider for help if you need support or information about quitting drugs. General instructions Schedule regular health, dental, and eye exams. Stay current with your vaccines. Tell your health care provider if: You often feel depressed. You have ever been abused or do not feel safe at home. Summary Adopting a healthy lifestyle and getting preventive care are important in promoting health and wellness. Follow your health care provider's instructions about healthy diet, exercising, and getting tested or screened for diseases. Follow your health care provider's instructions on monitoring your cholesterol and blood pressure. This information is not intended to replace advice given to you by your health care provider. Make sure you discuss any questions you have with your health care provider. Document Revised: 01/01/2021 Document Reviewed: 01/01/2021 Elsevier Patient Education  2023 [employer]  

## 2022-03-25 NOTE — Progress Notes (Signed)
Subjective:    Patient ID: Eugene Love, male    DOB: 1986-01-30, 36 y.o.   MRN: [id number]  Chief Complaint  Patient presents with   Medical Management of Chronic Issues   PT presents to the office today for CPE and chronic follow up. [name] with a BMI 40. Has herpes and takes Valtrex as needed.  Hypertension This is a chronic problem. The current episode started more than 1 year ago. The problem has been resolved since onset. The problem is controlled. Associated symptoms include anxiety. Pertinent negatives include no malaise/fatigue or peripheral edema. Risk factors for coronary artery disease include dyslipidemia, obesity and male gender. The current treatment provides moderate improvement.  Insomnia Primary symptoms: difficulty falling asleep, frequent awakening, no malaise/fatigue.   The current episode started more than one year. The onset quality is gradual. The problem occurs intermittently. The treatment provided moderate relief.  Hyperlipidemia This is a chronic problem. The current episode started more than 1 year ago. Recent lipid tests were reviewed and are normal. Exacerbating diseases include obesity. Current antihyperlipidemic treatment includes statins. The current treatment provides moderate improvement of lipids. Risk factors for coronary artery disease include dyslipidemia, male sex, hypertension and a sedentary lifestyle.  Anxiety Presents for follow-up visit. Symptoms include depressed mood, excessive worry, insomnia and nervous/anxious behavior. Symptoms occur occasionally. The severity of symptoms is moderate.        Review of Systems  Constitutional:  Negative for malaise/fatigue.  Psychiatric/Behavioral:  The patient is nervous/anxious and has insomnia.   All other systems reviewed and are negative.  Family History  Problem Relation Age of Onset   Crohn's disease Father    Social History   Socioeconomic History   Marital status: Married     Spouse name: Engineer, civil (consulting)   Number of children: 3   Years of education: Not on file   Highest education level: Not on file  Occupational History   Not on file  Tobacco Use   Smoking status: Never   Smokeless tobacco: Current    Types: Snuff   Tobacco comments:    chews tobacco  Vaping Use   Vaping Use: Never used  Substance and Sexual Activity   Alcohol use: Yes    Comment: 6 beers over the last month    Drug use: No   Sexual activity: Not on file  Other Topics Concern   Not on file  Social History Narrative   Not on file   Social Determinants of Health   Financial Resource Strain: Not on file  Food Insecurity: Not on file  Transportation Needs: Not on file  Physical Activity: Not on file  Stress: Not on file  Social Connections: Not on file        Objective:   Physical Exam Vitals reviewed.  Constitutional:      General: He is not in acute distress.    Appearance: He is well-developed.  HENT:     Head: Normocephalic.     Right Ear: There is impacted cerumen.     Left Ear: Tympanic membrane normal.  Eyes:     General:        Right eye: No discharge.        Left eye: No discharge.     Pupils: Pupils are equal, round, and reactive to light.  Neck:     Thyroid: No thyromegaly.  Cardiovascular:     Rate and Rhythm: Normal rate and regular rhythm.     Heart sounds:  Normal heart sounds. No murmur heard. Pulmonary:     Effort: Pulmonary effort is normal. No respiratory distress.     Breath sounds: Normal breath sounds. No wheezing.  Abdominal:     General: Bowel sounds are normal. There is no distension.     Palpations: Abdomen is soft.     Tenderness: There is no abdominal tenderness.  Musculoskeletal:        General: No tenderness. Normal range of motion.     Cervical back: Normal range of motion and neck supple.  Skin:    General: Skin is warm and dry.     Findings: No erythema or rash.  Neurological:     Mental Status: He is alert and oriented to person,  place, and time.     Cranial Nerves: No cranial nerve deficit.     Deep Tendon Reflexes: Reflexes are normal and symmetric.  Psychiatric:        Behavior: Behavior normal.        Thought Content: Thought content normal.        Judgment: Judgment normal.    Right ear washed with warm water and peroxide. TM WNL   BP 129/85   Pulse 81   Temp 98.3 F (36.8 C) (Oral)   Ht '5\' 11"'  (1.803 m)   Wt 287 lb 3.2 oz (130.3 kg)   BMI 40.06 kg/m      Assessment & Plan:  Eugene Love comes in today with chief complaint of Medical Management of Chronic Issues   Diagnosis and orders addressed:  1. Essential hypertension - losartan (COZAAR) 100 MG tablet; TAKE ONE (1) TABLET EACH DAY  Dispense: 90 tablet; Refill: 1 - hydrochlorothiazide (MICROZIDE) 12.5 MG capsule; Take 1 capsule (12.5 mg total) by mouth daily.  Dispense: 90 capsule; Refill: 1 - amLODipine (NORVASC) 5 MG tablet; TAKE ONE (1) TABLET EACH DAY  Dispense: 90 tablet; Refill: 1 - metoprolol succinate (TOPROL-XL) 50 MG 24 hr tablet; Take 1 tablet (50 mg total) by mouth daily. with food  Dispense: 90 tablet; Refill: 1 - CMP14+EGFR - CBC with Differential/Platelet  2. Annual physical exam - busPIRone (BUSPAR) 5 MG tablet; TAKE ONE TABLET 3 TIMES A DAY AS NEEDED.  Dispense: 90 tablet; Refill: 3 - CMP14+EGFR - CBC with Differential/Platelet - Lipid panel - TSH  3. Hyperlipidemia, unspecified hyperlipidemia type - atorvastatin (LIPITOR) 20 MG tablet; Take 1 tablet (20 mg total) by mouth daily.  Dispense: 90 tablet; Refill: 3 - CMP14+EGFR - CBC with Differential/Platelet - Lipid panel  4. Herpes - valACYclovir (VALTREX) 1000 MG tablet; Take 1 tablet (1,000 mg total) by mouth daily.  Dispense: 90 tablet; Refill: 3 - CMP14+EGFR - CBC with Differential/Platelet  5. Right ear impacted cerumen - CMP14+EGFR - CBC with Differential/Platelet  6. GAD (generalized anxiety disorder) - busPIRone (BUSPAR) 5 MG tablet; TAKE ONE TABLET 3  TIMES A DAY AS NEEDED.  Dispense: 90 tablet; Refill: 3 - CMP14+EGFR - CBC with Differential/Platelet  7. Primary insomnia - busPIRone (BUSPAR) 5 MG tablet; TAKE ONE TABLET 3 TIMES A DAY AS NEEDED.  Dispense: 90 tablet; Refill: 3 - CMP14+EGFR - CBC with Differential/Platelet   Labs pending Health Maintenance reviewed Diet and exercise encouraged  Follow up plan: 6 months    Evelina Dun, FNP

## 2022-03-26 LAB — CBC WITH DIFFERENTIAL/PLATELET
Basophils Absolute: 0 10*3/uL (ref 0.0–0.2)
Basos: 1 %
EOS (ABSOLUTE): 0.1 10*3/uL (ref 0.0–0.4)
Eos: 2 %
Hematocrit: 42.5 % (ref 37.5–51.0)
Hemoglobin: 15.1 g/dL (ref 13.0–17.7)
Immature Grans (Abs): 0 10*3/uL (ref 0.0–0.1)
Immature Granulocytes: 0 %
Lymphocytes Absolute: 1.3 10*3/uL (ref 0.7–3.1)
Lymphs: 19 %
MCH: 31.2 pg (ref 26.6–33.0)
MCHC: 35.5 g/dL (ref 31.5–35.7)
MCV: 88 fL (ref 79–97)
Monocytes Absolute: 0.4 10*3/uL (ref 0.1–0.9)
Monocytes: 6 %
Neutrophils Absolute: 5 10*3/uL (ref 1.4–7.0)
Neutrophils: 72 %
Platelets: 285 10*3/uL (ref 150–450)
RBC: 4.84 x10E6/uL (ref 4.14–5.80)
RDW: 11.5 % — ABNORMAL LOW (ref 11.6–15.4)
WBC: 6.9 10*3/uL (ref 3.4–10.8)

## 2022-03-26 LAB — CMP14+EGFR
ALT: 48 IU/L — ABNORMAL HIGH (ref 0–44)
AST: 35 IU/L (ref 0–40)
Albumin/Globulin Ratio: 1.8 (ref 1.2–2.2)
Albumin: 4.8 g/dL (ref 4.1–5.1)
Alkaline Phosphatase: 56 IU/L (ref 44–121)
BUN/Creatinine Ratio: 13 (ref 9–20)
BUN: 13 mg/dL (ref 6–20)
Bilirubin Total: 0.5 mg/dL (ref 0.0–1.2)
CO2: 20 mmol/L (ref 20–29)
Calcium: 9.8 mg/dL (ref 8.7–10.2)
Chloride: 102 mmol/L (ref 96–106)
Creatinine, Ser: 0.97 mg/dL (ref 0.76–1.27)
Globulin, Total: 2.7 g/dL (ref 1.5–4.5)
Glucose: 106 mg/dL — ABNORMAL HIGH (ref 70–99)
Potassium: 4.1 mmol/L (ref 3.5–5.2)
Sodium: 141 mmol/L (ref 134–144)
Total Protein: 7.5 g/dL (ref 6.0–8.5)
eGFR: 105 mL/min/{1.73_m2} (ref >59)

## 2022-03-26 LAB — LIPID PANEL
Chol/HDL Ratio: 4.6 ratio (ref 0.0–5.0)
Cholesterol, Total: 166 mg/dL (ref 100–199)
HDL: 36 mg/dL — ABNORMAL LOW (ref >39)
LDL Chol Calc (NIH): 54 mg/dL (ref 0–99)
Triglycerides: 510 mg/dL — ABNORMAL HIGH (ref 0–149)
VLDL Cholesterol Cal: 76 mg/dL — ABNORMAL HIGH (ref 5–40)

## 2022-03-26 LAB — TSH: TSH: 1.85 u[IU]/mL (ref 0.450–4.500)

## 2022-07-27 ENCOUNTER — Other Ambulatory Visit: Payer: Self-pay | Admitting: Family

## 2022-07-27 DIAGNOSIS — I10 Essential (primary) hypertension: Secondary | ICD-10-CM

## 2022-09-26 ENCOUNTER — Encounter: Payer: Self-pay | Admitting: Family

## 2022-09-26 ENCOUNTER — Ambulatory Visit: Payer: BC Managed Care – PPO | Admitting: Family

## 2022-09-26 VITALS — BP 121/79 | HR 75 | Temp 97.4°F | Ht 71.0 in | Wt 294.2 lb

## 2022-09-26 DIAGNOSIS — E785 Hyperlipidemia, unspecified: Secondary | ICD-10-CM | POA: Diagnosis not present

## 2022-09-26 DIAGNOSIS — F411 Generalized anxiety disorder: Secondary | ICD-10-CM | POA: Diagnosis not present

## 2022-09-26 DIAGNOSIS — B009 Herpesviral infection, unspecified: Secondary | ICD-10-CM

## 2022-09-26 DIAGNOSIS — I10 Essential (primary) hypertension: Secondary | ICD-10-CM

## 2022-09-26 MED ORDER — AMLODIPINE BESYLATE 5 MG PO TABS: ORAL_TABLET | ORAL | 1 refills | Status: DC

## 2022-09-26 MED ORDER — HYDROCHLOROTHIAZIDE 12.5 MG PO CAPS: 12.5000 mg | ORAL_CAPSULE | Freq: Every day | ORAL | 1 refills | Status: DC

## 2022-09-26 MED ORDER — METOPROLOL SUCCINATE ER 50 MG PO TB24: 50.0000 mg | ORAL_TABLET | Freq: Every day | ORAL | 1 refills | Status: DC

## 2022-09-26 NOTE — Progress Notes (Signed)
Subjective:    Patient ID: Eugene Love, male    DOB: 02-26-1986, 37 y.o.   MRN: [id number]  Chief Complaint  Patient presents with   Medical Management of Chronic Issues   PT presents to the office today for chronic follow up. [name] with a BMI 41. Has herpes and takes Valtrex as needed.   Hypertension This is a chronic problem. The current episode started more than 1 year ago. The problem has been resolved since onset. Associated symptoms include peripheral edema ("slightly"). Pertinent negatives include no shortness of breath. Risk factors for coronary artery disease include dyslipidemia, obesity and male gender. The current treatment provides moderate improvement. There is no history of heart failure.  Hyperlipidemia This is a chronic problem. The current episode started more than 1 year ago. The problem is controlled. Exacerbating diseases include obesity. Pertinent negatives include no shortness of breath. Current antihyperlipidemic treatment includes statins. The current treatment provides moderate improvement of lipids. Risk factors for coronary artery disease include dyslipidemia, hypertension, male sex and a sedentary lifestyle.  Anxiety Presents for follow-up visit. Symptoms include excessive worry and nervous/anxious behavior. Patient reports no irritability or shortness of breath. Symptoms occur rarely. The severity of symptoms is mild.        Review of Systems  Constitutional:  Negative for irritability.  Respiratory:  Negative for shortness of breath.   Psychiatric/Behavioral:  The patient is nervous/anxious.   All other systems reviewed and are negative.      Objective:   Physical Exam Vitals reviewed.  Constitutional:      General: He is not in acute distress.    Appearance: He is well-developed. He is obese.  HENT:     Head: Normocephalic.     Right Ear: Tympanic membrane normal.     Left Ear: Tympanic membrane normal.  Eyes:     General:         Right eye: No discharge.        Left eye: No discharge.     Pupils: Pupils are equal, round, and reactive to light.  Neck:     Thyroid: No thyromegaly.  Cardiovascular:     Rate and Rhythm: Normal rate and regular rhythm.     Heart sounds: Normal heart sounds. No murmur heard. Pulmonary:     Effort: Pulmonary effort is normal. No respiratory distress.     Breath sounds: Normal breath sounds. No wheezing.  Abdominal:     General: Bowel sounds are normal. There is no distension.     Palpations: Abdomen is soft.     Tenderness: There is no abdominal tenderness.  Musculoskeletal:        General: No tenderness. Normal range of motion.     Cervical back: Normal range of motion and neck supple.     Right lower leg: Edema (trace) present.     Left lower leg: Edema (trace) present.  Skin:    General: Skin is warm and dry.     Findings: No erythema or rash.  Neurological:     Mental Status: He is alert and oriented to person, place, and time.     Cranial Nerves: No cranial nerve deficit.     Deep Tendon Reflexes: Reflexes are normal and symmetric.  Psychiatric:        Behavior: Behavior normal.        Thought Content: Thought content normal.        Judgment: Judgment normal.  BP 121/79   Pulse 75   Temp (!) 97.4 F (36.3 C) (Temporal)   Ht 5\' 11"  (1.803 m)   Wt 294 lb 3.2 oz (133.4 kg)   SpO2 93%   BMI 41.03 kg/m      Assessment & Plan:  [name] comes in today with chief complaint of Medical Management of Chronic Issues   Diagnosis and orders addressed:  1. Essential hypertension - amLODipine (NORVASC) 5 MG tablet; TAKE ONE (1) TABLET EACH DAY  Dispense: 90 tablet; Refill: 1 - hydrochlorothiazide (MICROZIDE) 12.5 MG capsule; Take 1 capsule (12.5 mg total) by mouth daily.  Dispense: 90 capsule; Refill: 1 - metoprolol succinate (TOPROL-XL) 50 MG 24 hr tablet; Take 1 tablet (50 mg total) by mouth daily. with food  Dispense: 90 tablet; Refill: 1 - CMP14+EGFR  2.  GAD (generalized anxiety disorder) - CMP14+EGFR  3. Herpes - CMP14+EGFR  4. Hyperlipidemia, unspecified hyperlipidemia type  - CMP14+EGFR   Labs pending Continue current medications  Health Maintenance reviewed Diet and exercise encouraged  Follow up plan: 6 months    Evelina Dun, FNP

## 2022-09-26 NOTE — Patient Instructions (Signed)
Health Maintenance, Male Adopting a healthy lifestyle and getting preventive care are important in promoting health and wellness. Ask your health care provider about: The right schedule for you to have regular tests and exams. Things you can do on your own to prevent diseases and keep yourself healthy. What should I know about diet, weight, and exercise? Eat a healthy diet  Eat a diet that includes plenty of vegetables, fruits, low-fat dairy products, and lean protein. Do not eat a lot of foods that are high in solid fats, added sugars, or sodium. Maintain a healthy weight Body mass index (BMI) is a measurement that can be used to identify possible weight problems. It estimates body fat based on height and weight. Your health care provider can help determine your BMI and help you achieve or maintain a healthy weight. Get regular exercise Get regular exercise. This is one of the most important things you can do for your health. Most adults should: Exercise for at least 150 minutes each week. The exercise should increase your heart rate and make you sweat (moderate-intensity exercise). Do strengthening exercises at least twice a week. This is in addition to the moderate-intensity exercise. Spend less time sitting. Even light physical activity can be beneficial. Watch cholesterol and blood lipids Have your blood tested for lipids and cholesterol at 35 years of age, then have this test every 5 years. You may need to have your cholesterol levels checked more often if: Your lipid or cholesterol levels are high. You are older than 35 years of age. You are at high risk for heart disease. What should I know about cancer screening? Many types of cancers can be detected early and may often be prevented. Depending on your health history and family history, you may need to have cancer screening at various ages. This may include screening for: Colorectal cancer. Prostate cancer. Skin cancer. Lung  cancer. What should I know about heart disease, diabetes, and high blood pressure? Blood pressure and heart disease High blood pressure causes heart disease and increases the risk of stroke. This is more likely to develop in people who have high blood pressure readings or are overweight. Talk with your health care provider about your target blood pressure readings. Have your blood pressure checked: Every 3-5 years if you are 18-39 years of age. Every year if you are 40 years old or older. If you are between the ages of 65 and 75 and are a current or former smoker, ask your health care provider if you should have a one-time screening for abdominal aortic aneurysm (AAA). Diabetes Have regular diabetes screenings. This checks your fasting blood sugar level. Have the screening done: Once every three years after age 45 if you are at a normal weight and have a low risk for diabetes. More often and at a younger age if you are overweight or have a high risk for diabetes. What should I know about preventing infection? Hepatitis B If you have a higher risk for hepatitis B, you should be screened for this virus. Talk with your health care provider to find out if you are at risk for hepatitis B infection. Hepatitis C Blood testing is recommended for: Everyone born from 1945 through 1965. Anyone with known risk factors for hepatitis C. Sexually transmitted infections (STIs) You should be screened each year for STIs, including gonorrhea and chlamydia, if: You are sexually active and are younger than 35 years of age. You are older than 35 years of age and your   health care provider tells you that you are at risk for this type of infection. Your sexual activity has changed since you were last screened, and you are at increased risk for chlamydia or gonorrhea. Ask your health care provider if you are at risk. Ask your health care provider about whether you are at high risk for HIV. Your health care provider  may recommend a prescription medicine to help prevent HIV infection. If you choose to take medicine to prevent HIV, you should first get tested for HIV. You should then be tested every 3 months for as long as you are taking the medicine. Follow these instructions at home: Alcohol use Do not drink alcohol if your health care provider tells you not to drink. If you drink alcohol: Limit how much you have to 0-2 drinks a day. Know how much alcohol is in your drink. In the [place] one drink equals one 12 oz bottle of beer (355 mL), one 5 oz glass of wine (148 mL), or one 1 oz glass of hard liquor (44 mL). Lifestyle Do not use any products that contain nicotine or tobacco. These products include cigarettes, chewing tobacco, and vaping devices, such as e-cigarettes. If you need help quitting, ask your health care provider. Do not use street drugs. Do not share needles. Ask your health care provider for help if you need support or information about quitting drugs. General instructions Schedule regular health, dental, and eye exams. Stay current with your vaccines. Tell your health care provider if: You often feel depressed. You have ever been abused or do not feel safe at home. Summary Adopting a healthy lifestyle and getting preventive care are important in promoting health and wellness. Follow your health care provider's instructions about healthy diet, exercising, and getting tested or screened for diseases. Follow your health care provider's instructions on monitoring your cholesterol and blood pressure. This information is not intended to replace advice given to you by your health care provider. Make sure you discuss any questions you have with your health care provider. Document Revised: 01/01/2021 Document Reviewed: 01/01/2021 Elsevier Patient Education  2023 Elsevier Inc.  

## 2022-09-27 LAB — CMP14+EGFR
ALT: 64 IU/L — ABNORMAL HIGH (ref 0–44)
AST: 31 IU/L (ref 0–40)
Albumin/Globulin Ratio: 1.8 (ref 1.2–2.2)
Albumin: 4.6 g/dL (ref 4.1–5.1)
Alkaline Phosphatase: 61 IU/L (ref 44–121)
BUN/Creatinine Ratio: 11 (ref 9–20)
BUN: 9 mg/dL (ref 6–20)
Bilirubin Total: 0.3 mg/dL (ref 0.0–1.2)
CO2: 23 mmol/L (ref 20–29)
Calcium: 9.5 mg/dL (ref 8.7–10.2)
Chloride: 102 mmol/L (ref 96–106)
Creatinine, Ser: 0.84 mg/dL (ref 0.76–1.27)
Globulin, Total: 2.5 g/dL (ref 1.5–4.5)
Glucose: 105 mg/dL — ABNORMAL HIGH (ref 70–99)
Potassium: 4 mmol/L (ref 3.5–5.2)
Sodium: 138 mmol/L (ref 134–144)
Total Protein: 7.1 g/dL (ref 6.0–8.5)
eGFR: 117 mL/min/{1.73_m2} (ref >59)

## 2022-10-18 ENCOUNTER — Telehealth: Payer: Self-pay | Admitting: Family

## 2022-10-18 ENCOUNTER — Ambulatory Visit (HOSPITAL_COMMUNITY)
Admission: RE | Admit: 2022-10-18 | Discharge: 2022-10-18 | Disposition: A | Discharge status: Home / Self Care | Payer: BC Managed Care – PPO | Source: Ambulatory Visit | Attending: Nurse Practitioner | Admitting: Nurse Practitioner

## 2022-10-18 ENCOUNTER — Encounter: Payer: Self-pay | Admitting: Nurse Practitioner

## 2022-10-18 ENCOUNTER — Ambulatory Visit: Payer: BC Managed Care – PPO | Admitting: Nurse Practitioner

## 2022-10-18 VITALS — BP 140/79 | HR 77 | Temp 98.3°F | Resp 20 | Ht 71.0 in | Wt 290.0 lb

## 2022-10-18 DIAGNOSIS — N5089 Other specified disorders of the male genital organs: Secondary | ICD-10-CM | POA: Diagnosis not present

## 2022-10-18 MED ORDER — CEPHALEXIN 500 MG PO CAPS: 500.0000 mg | ORAL_CAPSULE | Freq: Three times a day (TID) | ORAL | 0 refills | Status: DC

## 2022-10-18 NOTE — Progress Notes (Signed)
Subjective:    Patient ID: Eugene Love, male    DOB: [birth year], 37 y.o.   MRN: [id number]   Chief Complaint: Swelling in private parts (Started at lunch time yesterday)   HPI Patient states that he started having pain in right testicle. He "twiked " his back on Saturday and he is not sure if this is related.  Patient Active Problem List   Diagnosis Date Noted   Hyperlipemia 03/27/2021   GAD (generalized anxiety disorder) 09/18/2020   Cervical disc disease with myelopathy 04/23/2018   Herpes 02/25/2017   Essential hypertension 02/25/2017       Review of Systems  Constitutional:  Negative for diaphoresis.  Eyes:  Negative for pain.  Respiratory:  Negative for shortness of breath.   Cardiovascular:  Negative for chest pain, palpitations and leg swelling.  Gastrointestinal:  Negative for abdominal pain.  Endocrine: Negative for polydipsia.  Skin:  Negative for rash.  Neurological:  Negative for dizziness, weakness and headaches.  Hematological:  Does not bruise/bleed easily.  All other systems reviewed and are negative.      Objective:   Physical Exam Vitals and nursing note reviewed.  Constitutional:      Appearance: Normal appearance. He is well-developed.  HENT:     Head: Normocephalic.     Nose: Nose normal.     Mouth/Throat:     Mouth: Mucous membranes are moist.     Pharynx: Oropharynx is clear.  Eyes:     Pupils: Pupils are equal, round, and reactive to light.  Neck:     Thyroid: No thyroid mass or thyromegaly.     Vascular: No carotid bruit or JVD.     Trachea: Phonation normal.  Cardiovascular:     Rate and Rhythm: Normal rate and regular rhythm.  Pulmonary:     Effort: Pulmonary effort is normal. No respiratory distress.     Breath sounds: Normal breath sounds.  Abdominal:     General: Bowel sounds are normal.     Palpations: Abdomen is soft.     Tenderness: There is no abdominal tenderness.  Genitourinary:    Comments: Tender testicular masson  right posteriorly Musculoskeletal:        General: Normal range of motion.     Cervical back: Normal range of motion and neck supple.  Lymphadenopathy:     Cervical: No cervical adenopathy.  Skin:    General: Skin is warm and dry.  Neurological:     Mental Status: He is alert and oriented to person, place, and time.  Psychiatric:        Behavior: Behavior normal.        Thought Content: Thought content normal.        Judgment: Judgment normal.    BP (!) 140/79   Pulse 77   Temp 98.3 F (36.8 C) (Temporal)   Resp 20   Ht '5\' 11"'$  (1.803 m)   Wt 290 lb (131.5 kg)   SpO2 98%   BMI 40.45 kg/m          Assessment & Plan:  Curren Delon in today with chief complaint of Swelling in private parts (Started at lunch time yesterday)   1. Testicular mass Will talk after u/s completed. - US Scrotum; Future    The above assessment and management plan was discussed with the patient. The patient verbalized understanding of and has agreed to the management plan. Patient is aware to call the clinic if symptoms persist or worsen. Patient is aware  when to return to the clinic for a follow-up visit. Patient educated on when it is appropriate to go to the emergency department.   Mary-Margaret Hassell Done, FNP

## 2022-10-18 NOTE — Telephone Encounter (Signed)
Patient has appt with Eugene Love

## 2022-10-18 NOTE — Telephone Encounter (Signed)
Patient called to see if he could be seen today for possible hernia. Explained to patient that we didn't have any openings but that I would check with clinical to see if he could be worked in.  Per clinical, they are all booked today. Would only be able to be seen today if we had cancellations.   Tried calling pt to let him know but NA/NVM. If patient calls back, let him know and offer appointment for Monday.

## 2022-10-18 NOTE — Addendum Note (Signed)
Addended by: Chevis Pretty on: 10/18/2022 04:25 PM   Modules accepted: Orders

## 2022-10-18 NOTE — Addendum Note (Signed)
Addended by: Rolena Infante on: 10/18/2022 11:39 AM   Modules accepted: Orders

## 2022-11-25 ENCOUNTER — Other Ambulatory Visit: Payer: Self-pay | Admitting: Family

## 2022-11-25 DIAGNOSIS — I10 Essential (primary) hypertension: Secondary | ICD-10-CM

## 2023-01-29 ENCOUNTER — Ambulatory Visit: Payer: BC Managed Care – PPO | Admitting: Nurse Practitioner

## 2023-01-29 ENCOUNTER — Encounter: Payer: Self-pay | Admitting: Nurse Practitioner

## 2023-01-29 VITALS — BP 126/79 | HR 70 | Temp 97.4°F | Ht 71.0 in | Wt 291.0 lb

## 2023-01-29 DIAGNOSIS — J3489 Other specified disorders of nose and nasal sinuses: Secondary | ICD-10-CM | POA: Diagnosis not present

## 2023-01-29 DIAGNOSIS — R051 Acute cough: Secondary | ICD-10-CM

## 2023-01-29 DIAGNOSIS — J029 Acute pharyngitis, unspecified: Secondary | ICD-10-CM

## 2023-01-29 DIAGNOSIS — R0981 Nasal congestion: Secondary | ICD-10-CM

## 2023-01-29 LAB — CULTURE, GROUP A STREP

## 2023-01-29 LAB — VERITOR FLU A/B WAIVED
Influenza A: NEGATIVE
Influenza B: NEGATIVE

## 2023-01-29 LAB — RAPID STREP SCREEN (MED CTR MEBANE ONLY): Strep Gp A Ag, IA W/Reflex: NEGATIVE

## 2023-01-29 MED ORDER — BENZONATATE 100 MG PO CAPS: 100.0000 mg | ORAL_CAPSULE | Freq: Three times a day (TID) | ORAL | 0 refills | Status: DC | PRN

## 2023-01-29 MED ORDER — FLUTICASONE PROPIONATE 50 MCG/ACT NA SUSP: 2.0000 | Freq: Every day | NASAL | 6 refills | Status: DC

## 2023-01-29 NOTE — Progress Notes (Signed)
Acute Office Visit  Subjective:     Patient ID: Eugene Love, male    DOB: December 24, 1985, 37 y.o.   MRN: [id number]  Chief Complaint  Patient presents with   Cough    Cough got bad last night. Kids have been sick for past week.    Nasal Congestion    Nasal congestion, runny nose   Sore Throat    HPI  Eugene Love is a 37 y.o. male who complains of congestion, sore throat, sneezing, SOB with a  dry and nonproductive for 1- day. wife was sick last week with URI and his daughter was just dx with croup. He denies a history of anorexia, chest pain, chills, dizziness, fatigue, fevers, myalgias, nausea, and vomiting and denies a history of asthma. Patient has smoke cigarettes. Has not take any OTC medications. We will check him for flu, COVID and Strep  POC negative flu, COVID and Strep ROS Negative unless indicated in HPI    Objective:    BP 126/79   Pulse 70   Temp (!) 97.4 F (36.3 C) (Temporal)   Ht 5\' 11"  (1.803 m)   Wt 291 lb (132 kg)   SpO2 97%   BMI 40.59 kg/m  BP Readings from Last 3 Encounters:  01/29/23 126/79  10/18/22 (!) 140/79  09/26/22 121/79   Wt Readings from Last 3 Encounters:  01/29/23 291 lb (132 kg)  10/18/22 290 lb (131.5 kg)  09/26/22 294 lb 3.2 oz (133.4 kg)      Physical Exam Vitals and nursing note reviewed.  Constitutional:      General: He is not in acute distress.    Appearance: He is well-developed and overweight. He is not ill-appearing.  HENT:     Head: Normocephalic and atraumatic.     Comments:       Right Ear: Tympanic membrane and ear canal normal.     Left Ear: Tympanic membrane and ear canal normal.     Nose: Congestion and rhinorrhea present.     Comments:  Nose is congested. Sinuses non tender    Mouth/Throat:     Tonsils: No tonsillar exudate or tonsillar abscesses.  Eyes:     Conjunctiva/sclera: Conjunctivae normal.     Pupils: Pupils are equal, round, and reactive to light.  Cardiovascular:     Rate and Rhythm:  Normal rate.     Heart sounds: Normal heart sounds.  Pulmonary:     Effort: Pulmonary effort is normal. No respiratory distress.     Breath sounds: Normal breath sounds.  Skin:    General: Skin is warm and dry.     Findings: No rash.  Neurological:     General: No focal deficit present.     Mental Status: He is alert and oriented to person, place, and time.  Psychiatric:        Mood and Affect: Mood normal.        Behavior: Behavior normal.     No results found for any visits on 01/29/23.      Assessment & Plan:  Acute cough -     Veritor Flu A/B Waived -     Novel Coronavirus, NAA ([facility]) -     Benzonatate; Take 1 capsule (100 mg total) by mouth 3 (three) times daily as needed for cough.  Dispense: 30 capsule; Refill: 0  Sore throat -     Rapid Strep Screen ([facility] ONLY)  Nasal congestion with rhinorrhea -  Fluticasone Propionate; Place 2 sprays into both nostrils daily.  Dispense: 16 g; Refill: 6   ASSESSMENT:  viral upper respiratory illness  PLAN: URI/cough Tessalon Pearls 100 mg 1-tab TID PRN for cough Rhinorrhea with congestion Flonase 2 spays in each Nostril  Symptomatic therapy suggested: push fluids, rest, use vaporizer or mist prn, and return office visit prn if symptoms persist or worsen. Lack of antibiotic effectiveness discussed with him. Call or return to clinic prn if these symptoms worsen or fail to improve as anticipated.   Return in about 2 weeks (around 02/12/2023), or if symptoms worsen or fail to improve.  [address] [place] Lighter DNP

## 2023-01-30 LAB — NOVEL CORONAVIRUS, NAA: SARS-CoV-2, NAA: NOT DETECTED

## 2023-02-03 ENCOUNTER — Encounter: Payer: Self-pay | Admitting: *Deleted

## 2023-02-28 ENCOUNTER — Other Ambulatory Visit: Payer: Self-pay | Admitting: Family

## 2023-02-28 DIAGNOSIS — F411 Generalized anxiety disorder: Secondary | ICD-10-CM

## 2023-02-28 DIAGNOSIS — F5101 Primary insomnia: Secondary | ICD-10-CM

## 2023-02-28 DIAGNOSIS — E785 Hyperlipidemia, unspecified: Secondary | ICD-10-CM

## 2023-02-28 DIAGNOSIS — I10 Essential (primary) hypertension: Secondary | ICD-10-CM

## 2023-02-28 DIAGNOSIS — Z0000 Encounter for general adult medical examination without abnormal findings: Secondary | ICD-10-CM

## 2023-03-21 ENCOUNTER — Ambulatory Visit: Payer: BC Managed Care – PPO | Admitting: Family

## 2023-03-25 ENCOUNTER — Encounter: Payer: Self-pay | Admitting: Family

## 2023-03-25 ENCOUNTER — Ambulatory Visit: Payer: BC Managed Care – PPO

## 2023-03-25 VITALS — BP 110/71 | HR 69 | Temp 98.3°F | Ht 71.0 in | Wt 295.0 lb

## 2023-03-25 DIAGNOSIS — F411 Generalized anxiety disorder: Secondary | ICD-10-CM | POA: Diagnosis not present

## 2023-03-25 DIAGNOSIS — E785 Hyperlipidemia, unspecified: Secondary | ICD-10-CM

## 2023-03-25 DIAGNOSIS — B009 Herpesviral infection, unspecified: Secondary | ICD-10-CM

## 2023-03-25 DIAGNOSIS — Z0001 Encounter for general adult medical examination with abnormal findings: Secondary | ICD-10-CM

## 2023-03-25 DIAGNOSIS — Z0000 Encounter for general adult medical examination without abnormal findings: Secondary | ICD-10-CM

## 2023-03-25 DIAGNOSIS — I10 Essential (primary) hypertension: Secondary | ICD-10-CM

## 2023-03-25 DIAGNOSIS — F5101 Primary insomnia: Secondary | ICD-10-CM

## 2023-03-25 LAB — CMP14+EGFR

## 2023-03-25 LAB — CBC WITH DIFFERENTIAL/PLATELET
Basophils Absolute: 0 10*3/uL (ref 0.0–0.2)
Basos: 1 %
EOS (ABSOLUTE): 0.2 10*3/uL (ref 0.0–0.4)
Eos: 3 %
Hematocrit: 41.8 % (ref 37.5–51.0)
Hemoglobin: 14.2 g/dL (ref 13.0–17.7)
Immature Grans (Abs): 0 10*3/uL (ref 0.0–0.1)
Immature Granulocytes: 0 %
Lymphocytes Absolute: 2 10*3/uL (ref 0.7–3.1)
Lymphs: 32 %
MCH: 30 pg (ref 26.6–33.0)
MCHC: 34 g/dL (ref 31.5–35.7)
MCV: 88 fL (ref 79–97)
Monocytes Absolute: 0.6 10*3/uL (ref 0.1–0.9)
Monocytes: 9 %
Neutrophils Absolute: 3.4 10*3/uL (ref 1.4–7.0)
Neutrophils: 55 %
Platelets: 242 10*3/uL (ref 150–450)
RBC: 4.73 x10E6/uL (ref 4.14–5.80)
RDW: 11.7 % (ref 11.6–15.4)
WBC: 6.1 10*3/uL (ref 3.4–10.8)

## 2023-03-25 LAB — LIPID PANEL

## 2023-03-25 LAB — TSH

## 2023-03-25 MED ORDER — LOSARTAN POTASSIUM 100 MG PO TABS
ORAL_TABLET | ORAL | 0 refills | Status: DC
Start: 2023-03-25 — End: 2023-06-20

## 2023-03-25 MED ORDER — BUSPIRONE HCL 5 MG PO TABS
ORAL_TABLET | ORAL | 0 refills | Status: DC
Start: 2023-03-25 — End: 2023-10-02

## 2023-03-25 MED ORDER — AMLODIPINE BESYLATE 5 MG PO TABS
ORAL_TABLET | ORAL | 1 refills | Status: DC
Start: 2023-03-25 — End: 2024-01-13

## 2023-03-25 MED ORDER — ATORVASTATIN CALCIUM 20 MG PO TABS
20.0000 mg | ORAL_TABLET | Freq: Every day | ORAL | 0 refills | Status: DC
Start: 2023-03-25 — End: 2023-06-20

## 2023-03-25 MED ORDER — METOPROLOL SUCCINATE ER 50 MG PO TB24
50.0000 mg | ORAL_TABLET | Freq: Every day | ORAL | 1 refills | Status: DC
Start: 2023-03-25 — End: 2024-01-13

## 2023-03-25 MED ORDER — HYDROCHLOROTHIAZIDE 12.5 MG PO CAPS
12.5000 mg | ORAL_CAPSULE | Freq: Every day | ORAL | 1 refills | Status: DC
Start: 2023-03-25 — End: 2024-01-13

## 2023-03-25 NOTE — Progress Notes (Signed)
Subjective:    Patient ID: Eugene Love, male    DOB: 07-25-1986, 37 y.o.   MRN: [id number]  Chief Complaint  Patient presents with   Medical Management of Chronic Issues   PT presents to the office today for CPE and chronic follow up.   [name] with a BMI 40.  ] Has herpes and takes Valtrex as needed. States his last flare up was when he was at the beach and in the sun for long periods of time.  Hypertension This is a chronic problem. The current episode started more than 1 year ago. Associated symptoms include anxiety, malaise/fatigue and peripheral edema. Pertinent negatives include no shortness of breath. Risk factors for coronary artery disease include dyslipidemia, obesity and male gender. The current treatment provides moderate improvement.  Anxiety Presents for follow-up visit. Symptoms include excessive worry and nervous/anxious behavior. Patient reports no shortness of breath. Symptoms occur occasionally. The severity of symptoms is mild.    Hyperlipidemia This is a chronic problem. The current episode started more than 1 year ago. Exacerbating diseases include obesity. Pertinent negatives include no shortness of breath. Current antihyperlipidemic treatment includes statins. The current treatment provides moderate improvement of lipids. Risk factors for coronary artery disease include dyslipidemia, male sex, hypertension and a sedentary lifestyle.      Review of Systems  Constitutional:  Positive for malaise/fatigue.  Respiratory:  Negative for shortness of breath.   Psychiatric/Behavioral:  The patient is nervous/anxious.   All other systems reviewed and are negative.  Family History  Problem Relation Age of Onset   Crohn's disease Father    Social History   Socioeconomic History   Marital status: Married    Spouse name: Scientist, research (medical)   Number of children: 3   Years of education: Not on file   Highest education level: Not on file  Occupational History    Not on file  Tobacco Use   Smoking status: Never   Smokeless tobacco: Current    Types: Snuff   Tobacco comments:    chews tobacco  Vaping Use   Vaping status: Never Used  Substance and Sexual Activity   Alcohol use: Yes    Comment: 6 beers over the last month    Drug use: No   Sexual activity: Not on file  Other Topics Concern   Not on file  Social History Narrative   Not on file   Social Determinants of Health   Financial Resource Strain: Patient Declined (01/29/2023)   Overall Financial Resource Strain (CARDIA)    Difficulty of Paying Living Expenses: Patient declined  Food Insecurity: No Food Insecurity (01/29/2023)   Hunger Vital Sign    Worried About Running Out of Food in the Last Year: Never true    Ran Out of Food in the Last Year: Never true  Transportation Needs: No Transportation Needs (01/29/2023)   PRAPARE - Administrator, Civil Service (Medical): No    Lack of Transportation (Non-Medical): No  Physical Activity: Sufficiently Active (01/29/2023)   Exercise Vital Sign    Days of Exercise per Week: 5 days    Minutes of Exercise per Session: 30 min  Stress: No Stress Concern Present (01/29/2023)   [employer] of Occupational Health - Occupational Stress Questionnaire    Feeling of Stress : Not at all  Social Connections: Unknown (01/29/2023)   Social Connection and Isolation Panel [NHANES]    Frequency of Communication with Friends and Family: Patient declined  Frequency of Social Gatherings with Friends and Family: Patient declined    Attends Religious Services: Patient declined    Active Member of Clubs or Organizations: Patient declined    Attends Banker Meetings: Not on file    Marital Status: Patient declined       Objective:   Physical Exam Vitals reviewed.  Constitutional:      General: He is not in acute distress.    Appearance: He is well-developed. He is obese.  HENT:     Head: Normocephalic.     Right Ear: Tympanic  membrane normal. There is no impacted cerumen.     Left Ear: Tympanic membrane normal.  Eyes:     General:        Right eye: No discharge.        Left eye: No discharge.     Pupils: Pupils are equal, round, and reactive to light.  Neck:     Thyroid: No thyromegaly.  Cardiovascular:     Rate and Rhythm: Normal rate and regular rhythm.     Heart sounds: Normal heart sounds. No murmur heard. Pulmonary:     Effort: Pulmonary effort is normal. No respiratory distress.     Breath sounds: Normal breath sounds. No wheezing.  Abdominal:     General: Bowel sounds are normal. There is no distension.     Palpations: Abdomen is soft.     Tenderness: There is no abdominal tenderness.  Musculoskeletal:        General: No tenderness. Normal range of motion.     Cervical back: Normal range of motion and neck supple.  Skin:    General: Skin is warm and dry.     Findings: No erythema or rash.  Neurological:     Mental Status: He is alert and oriented to person, place, and time.     Cranial Nerves: No cranial nerve deficit.     Deep Tendon Reflexes: Reflexes are normal and symmetric.  Psychiatric:        Behavior: Behavior normal.        Thought Content: Thought content normal.        Judgment: Judgment normal.          BP 110/71   Pulse 69   Temp 98.3 F (36.8 C)   Ht 5\' 11"  (1.803 m)   Wt 295 lb (133.8 kg)   SpO2 97%   BMI 41.14 kg/m   Assessment & Plan:  Eugene Love comes in today with chief complaint of Medical Management of Chronic Issues and Annual Exam   Diagnosis and orders addressed:  1. Essential hypertension - amLODipine (NORVASC) 5 MG tablet; TAKE ONE (1) TABLET EACH DAY  Dispense: 90 tablet; Refill: 1 - hydrochlorothiazide (MICROZIDE) 12.5 MG capsule; Take 1 capsule (12.5 mg total) by mouth daily.  Dispense: 90 capsule; Refill: 1 - losartan (COZAAR) 100 MG tablet; TAKE ONE (1) TABLET EACH DAY (NEEDS TO BE SEEN BEFORE NEXT REFILL)  Dispense: 30 tablet; Refill: 0 -  metoprolol succinate (TOPROL-XL) 50 MG 24 hr tablet; Take 1 tablet (50 mg total) by mouth daily. with food  Dispense: 90 tablet; Refill: 1 - CBC with Differential/Platelet - CMP14+EGFR  2. Hyperlipidemia, unspecified hyperlipidemia type - atorvastatin (LIPITOR) 20 MG tablet; Take 1 tablet (20 mg total) by mouth daily. (NEEDS TO BE SEEN BEFORE NEXT REFILL)  Dispense: 30 tablet; Refill: 0 - CBC with Differential/Platelet - Lipid panel - CMP14+EGFR  3. Annual physical exam - busPIRone (BUSPAR) 5  MG tablet; TAKE ONE TABLET 3 TIMES A DAY AS NEEDED. (NEEDS TO BE SEEN BEFORE NEXT REFILL)  Dispense: 90 tablet; Refill: 0 - CBC with Differential/Platelet - Lipid panel - CMP14+EGFR - TSH  4. GAD (generalized anxiety disorder) - busPIRone (BUSPAR) 5 MG tablet; TAKE ONE TABLET 3 TIMES A DAY AS NEEDED. (NEEDS TO BE SEEN BEFORE NEXT REFILL)  Dispense: 90 tablet; Refill: 0 - CBC with Differential/Platelet - CMP14+EGFR  5. Primary insomnia - busPIRone (BUSPAR) 5 MG tablet; TAKE ONE TABLET 3 TIMES A DAY AS NEEDED. (NEEDS TO BE SEEN BEFORE NEXT REFILL)  Dispense: 90 tablet; Refill: 0 - CBC with Differential/Platelet - CMP14+EGFR  6. Herpes - CBC with Differential/Platelet - CMP14+EGFR   Labs pending Health Maintenance reviewed Diet and exercise encouraged  Follow up plan: 6 months    Jannifer Rodney, FNP

## 2023-03-25 NOTE — Patient Instructions (Signed)
Health Maintenance, Male Adopting a healthy lifestyle and getting preventive care are important in promoting health and wellness. Ask your health care provider about: The right schedule for you to have regular tests and exams. Things you can do on your own to prevent diseases and keep yourself healthy. What should I know about diet, weight, and exercise? Eat a healthy diet  Eat a diet that includes plenty of vegetables, fruits, low-fat dairy products, and lean protein. Do not eat a lot of foods that are high in solid fats, added sugars, or sodium. Maintain a healthy weight Body mass index (BMI) is a measurement that can be used to identify possible weight problems. It estimates body fat based on height and weight. Your health care provider can help determine your BMI and help you achieve or maintain a healthy weight. Get regular exercise Get regular exercise. This is one of the most important things you can do for your health. Most adults should: Exercise for at least 150 minutes each week. The exercise should increase your heart rate and make you sweat (moderate-intensity exercise). Do strengthening exercises at least twice a week. This is in addition to the moderate-intensity exercise. Spend less time sitting. Even light physical activity can be beneficial. Watch cholesterol and blood lipids Have your blood tested for lipids and cholesterol at 35 years of age, then have this test every 5 years. You may need to have your cholesterol levels checked more often if: Your lipid or cholesterol levels are high. You are older than 35 years of age. You are at high risk for heart disease. What should I know about cancer screening? Many types of cancers can be detected early and may often be prevented. Depending on your health history and family history, you may need to have cancer screening at various ages. This may include screening for: Colorectal cancer. Prostate cancer. Skin cancer. Lung  cancer. What should I know about heart disease, diabetes, and high blood pressure? Blood pressure and heart disease High blood pressure causes heart disease and increases the risk of stroke. This is more likely to develop in people who have high blood pressure readings or are overweight. Talk with your health care provider about your target blood pressure readings. Have your blood pressure checked: Every 3-5 years if you are 18-39 years of age. Every year if you are 40 years old or older. If you are between the ages of 65 and 75 and are a current or former smoker, ask your health care provider if you should have a one-time screening for abdominal aortic aneurysm (AAA). Diabetes Have regular diabetes screenings. This checks your fasting blood sugar level. Have the screening done: Once every three years after age 45 if you are at a normal weight and have a low risk for diabetes. More often and at a younger age if you are overweight or have a high risk for diabetes. What should I know about preventing infection? Hepatitis B If you have a higher risk for hepatitis B, you should be screened for this virus. Talk with your health care provider to find out if you are at risk for hepatitis B infection. Hepatitis C Blood testing is recommended for: Everyone born from 1945 through 1965. Anyone with known risk factors for hepatitis C. Sexually transmitted infections (STIs) You should be screened each year for STIs, including gonorrhea and chlamydia, if: You are sexually active and are younger than 35 years of age. You are older than 35 years of age and your   health care provider tells you that you are at risk for this type of infection. Your sexual activity has changed since you were last screened, and you are at increased risk for chlamydia or gonorrhea. Ask your health care provider if you are at risk. Ask your health care provider about whether you are at high risk for HIV. Your health care provider  may recommend a prescription medicine to help prevent HIV infection. If you choose to take medicine to prevent HIV, you should first get tested for HIV. You should then be tested every 3 months for as long as you are taking the medicine. Follow these instructions at home: Alcohol use Do not drink alcohol if your health care provider tells you not to drink. If you drink alcohol: Limit how much you have to 0-2 drinks a day. Know how much alcohol is in your drink. In the [place] one drink equals one 12 oz bottle of beer (355 mL), one 5 oz glass of wine (148 mL), or one 1 oz glass of hard liquor (44 mL). Lifestyle Do not use any products that contain nicotine or tobacco. These products include cigarettes, chewing tobacco, and vaping devices, such as e-cigarettes. If you need help quitting, ask your health care provider. Do not use street drugs. Do not share needles. Ask your health care provider for help if you need support or information about quitting drugs. General instructions Schedule regular health, dental, and eye exams. Stay current with your vaccines. Tell your health care provider if: You often feel depressed. You have ever been abused or do not feel safe at home. Summary Adopting a healthy lifestyle and getting preventive care are important in promoting health and wellness. Follow your health care provider's instructions about healthy diet, exercising, and getting tested or screened for diseases. Follow your health care provider's instructions on monitoring your cholesterol and blood pressure. This information is not intended to replace advice given to you by your health care provider. Make sure you discuss any questions you have with your health care provider. Document Revised: 01/01/2021 Document Reviewed: 01/01/2021 Elsevier Patient Education  2024 Elsevier Inc.  

## 2023-04-14 ENCOUNTER — Encounter: Payer: Self-pay | Admitting: *Deleted

## 2023-05-07 ENCOUNTER — Encounter: Payer: Self-pay | Admitting: Family Medicine

## 2023-05-07 ENCOUNTER — Ambulatory Visit: Payer: BC Managed Care – PPO | Admitting: Family Medicine

## 2023-05-07 VITALS — BP 120/80 | HR 79 | Temp 98.2°F | Ht 71.0 in | Wt 291.8 lb

## 2023-05-07 DIAGNOSIS — N3000 Acute cystitis without hematuria: Secondary | ICD-10-CM | POA: Diagnosis not present

## 2023-05-07 DIAGNOSIS — R300 Dysuria: Secondary | ICD-10-CM | POA: Diagnosis not present

## 2023-05-07 LAB — URINALYSIS, ROUTINE W REFLEX MICROSCOPIC
Bilirubin, UA: NEGATIVE
Glucose, UA: NEGATIVE
Ketones, UA: NEGATIVE
Leukocytes,UA: NEGATIVE
Nitrite, UA: POSITIVE — AB
Protein,UA: NEGATIVE
RBC, UA: NEGATIVE
Specific Gravity, UA: 1.02 (ref 1.005–1.030)
Urobilinogen, Ur: 0.2 mg/dL (ref 0.2–1.0)
pH, UA: 7 (ref 5.0–7.5)

## 2023-05-07 LAB — MICROSCOPIC EXAMINATION
RBC, Urine: NONE SEEN /HPF (ref 0–2)
Renal Epithel, UA: NONE SEEN /HPF
Yeast, UA: NONE SEEN

## 2023-05-07 MED ORDER — SULFAMETHOXAZOLE-TRIMETHOPRIM 800-160 MG PO TABS
1.0000 | ORAL_TABLET | Freq: Two times a day (BID) | ORAL | 0 refills | Status: AC
Start: 2023-05-07 — End: 2023-05-14

## 2023-05-07 NOTE — Progress Notes (Signed)
Acute Office Visit  Subjective:     Patient ID: Eugene Love, male    DOB: 1985/09/14, 37 y.o.   MRN: [id number]  Chief Complaint  Patient presents with   Dysuria    X 1 week     Pt presents today with dysuria for 1 week.   Dysuria  This is a new problem. The current episode started in the past 7 days. The problem occurs every urination. The problem has been waxing and waning. The quality of the pain is described as burning and aching. The pain is mild. There has been no fever. He is Sexually active. There is No history of pyelonephritis. Associated symptoms include frequency. Pertinent negatives include no chills, discharge, flank pain, hematuria, hesitancy, nausea, possible pregnancy, sweats, urgency or vomiting. He has tried increased fluids (AZO) for the symptoms. The treatment provided moderate relief.     Review of Systems  Constitutional:  Negative for chills, diaphoresis, fever, malaise/fatigue and weight loss.  Gastrointestinal:  Negative for abdominal pain, blood in stool, constipation, diarrhea, heartburn, melena, nausea and vomiting.  Genitourinary:  Positive for dysuria and frequency. Negative for flank pain, hematuria, hesitancy and urgency.  Skin:  Negative for itching and rash.  All other systems reviewed and are negative.       Objective:    BP 120/80   Pulse 79   Temp 98.2 F (36.8 C) (Temporal)   Ht 5\' 11"  (1.803 m)   Wt 291 lb 12.8 oz (132.4 kg)   SpO2 98%   BMI 40.70 kg/m  BP Readings from Last 3 Encounters:  05/07/23 120/80  03/25/23 110/71  01/29/23 126/79   Wt Readings from Last 3 Encounters:  05/07/23 291 lb 12.8 oz (132.4 kg)  03/25/23 295 lb (133.8 kg)  01/29/23 291 lb (132 kg)      Physical Exam Vitals and nursing note reviewed.  Constitutional:      General: He is not in acute distress.    Appearance: Normal appearance. He is well-developed and well-groomed. He is obese. He is not ill-appearing, toxic-appearing or diaphoretic.   HENT:     Head: Normocephalic and atraumatic.     Jaw: There is normal jaw occlusion.     Right Ear: Hearing normal.     Left Ear: Hearing normal.     Nose: Nose normal.     Mouth/Throat:     Lips: Pink.     Mouth: Mucous membranes are moist.     Pharynx: Oropharynx is clear. Uvula midline.  Eyes:     General: Lids are normal.     Extraocular Movements: Extraocular movements intact.     Conjunctiva/sclera: Conjunctivae normal.     Pupils: Pupils are equal, round, and reactive to light.  Neck:     Thyroid: No thyroid mass, thyromegaly or thyroid tenderness.     Vascular: No carotid bruit or JVD.     Trachea: Trachea and phonation normal.  Cardiovascular:     Rate and Rhythm: Normal rate and regular rhythm.     Chest Wall: PMI is not displaced.     Pulses: Normal pulses.     Heart sounds: Normal heart sounds. No murmur heard.    No friction rub. No gallop.  Pulmonary:     Effort: Pulmonary effort is normal. No respiratory distress.     Breath sounds: Normal breath sounds. No wheezing.  Abdominal:     General: Bowel sounds are normal. There is no distension or abdominal bruit.  Palpations: Abdomen is soft. There is no hepatomegaly, splenomegaly or mass.     Tenderness: There is no abdominal tenderness. There is no right CVA tenderness, left CVA tenderness, guarding or rebound.     Hernia: No hernia is present.  Musculoskeletal:        General: Normal range of motion.     Cervical back: Normal range of motion and neck supple.     Right lower leg: No edema.     Left lower leg: No edema.  Lymphadenopathy:     Cervical: No cervical adenopathy.  Skin:    General: Skin is warm and dry.     Capillary Refill: Capillary refill takes less than 2 seconds.     Coloration: Skin is not cyanotic, jaundiced or pale.     Findings: No rash.  Neurological:     General: No focal deficit present.     Mental Status: He is alert and oriented to person, place, and time.     Sensory:  Sensation is intact.     Motor: Motor function is intact.     Coordination: Coordination is intact.     Gait: Gait is intact.     Deep Tendon Reflexes: Reflexes are normal and symmetric.  Psychiatric:        Attention and Perception: Attention and perception normal.        Mood and Affect: Mood and affect normal.        Speech: Speech normal.        Behavior: Behavior normal. Behavior is cooperative.        Thought Content: Thought content normal.        Cognition and Memory: Cognition and memory normal.        Judgment: Judgment normal.     Results for orders placed or performed in visit on 05/07/23  Microscopic Examination   Urine  Result Value Ref Range   WBC, UA 0-5 0 - 5 /hpf   RBC, Urine None seen 0 - 2 /hpf   Epithelial Cells (non renal) 0-10 0 - 10 /hpf   Renal Epithel, UA None seen None seen /hpf   Mucus, UA Present (A) Not Estab.   Bacteria, UA Few (A) None seen/Few   Yeast, UA None seen None seen  Urinalysis, Routine w reflex microscopic  Result Value Ref Range   Specific Gravity, UA 1.020 1.005 - 1.030   pH, UA 7.0 5.0 - 7.5   Color, UA Yellow Yellow   Appearance Ur Clear Clear   Leukocytes,UA Negative Negative   Protein,UA Negative Negative/Trace   Glucose, UA Negative Negative   Ketones, UA Negative Negative   RBC, UA Negative Negative   Bilirubin, UA Negative Negative   Urobilinogen, Ur 0.2 0.2 - 1.0 mg/dL   Nitrite, UA Positive (A) Negative   Microscopic Examination See below:         Assessment & Plan:   Problem List Items Addressed This Visit   None Visit Diagnoses     Dysuria    -  Primary   Relevant Orders   Urinalysis, Routine w reflex microscopic (Completed)   Urine Culture   Acute cystitis without hematuria       Relevant Medications   sulfamethoxazole-trimethoprim (BACTRIM DS) 800-160 MG tablet       Meds ordered this encounter  Medications   sulfamethoxazole-trimethoprim (BACTRIM DS) 800-160 MG tablet    Sig: Take 1 tablet by  mouth 2 (two) times daily for 7 days.  Dispense:  14 tablet    Refill:  0    Order Specific Question:   Supervising Provider    Answer:   Mechele Claude [phone]    Return if symptoms worsen or fail to improve.  Kari Baars, FNP

## 2023-05-08 ENCOUNTER — Ambulatory Visit: Payer: BC Managed Care – PPO

## 2023-05-09 LAB — URINE CULTURE

## 2023-06-20 ENCOUNTER — Other Ambulatory Visit: Payer: Self-pay | Admitting: Family

## 2023-06-20 DIAGNOSIS — E785 Hyperlipidemia, unspecified: Secondary | ICD-10-CM

## 2023-06-20 DIAGNOSIS — I10 Essential (primary) hypertension: Secondary | ICD-10-CM

## 2023-10-02 ENCOUNTER — Other Ambulatory Visit: Payer: Self-pay | Admitting: Family

## 2023-10-02 DIAGNOSIS — Z0000 Encounter for general adult medical examination without abnormal findings: Secondary | ICD-10-CM

## 2023-10-02 DIAGNOSIS — F411 Generalized anxiety disorder: Secondary | ICD-10-CM

## 2023-10-02 DIAGNOSIS — F5101 Primary insomnia: Secondary | ICD-10-CM

## 2024-01-13 ENCOUNTER — Other Ambulatory Visit: Payer: Self-pay | Admitting: Family

## 2024-01-13 DIAGNOSIS — B009 Herpesviral infection, unspecified: Secondary | ICD-10-CM

## 2024-01-13 DIAGNOSIS — I10 Essential (primary) hypertension: Secondary | ICD-10-CM

## 2024-03-25 ENCOUNTER — Encounter: Payer: BC Managed Care – PPO | Admitting: Family

## 2024-03-30 ENCOUNTER — Encounter: Payer: Self-pay | Admitting: Family

## 2024-03-30 ENCOUNTER — Ambulatory Visit: Payer: BC Managed Care – PPO | Admitting: Family

## 2024-03-30 VITALS — BP 125/88 | HR 69 | Temp 98.8°F | Ht 71.0 in | Wt 276.0 lb

## 2024-03-30 DIAGNOSIS — E785 Hyperlipidemia, unspecified: Secondary | ICD-10-CM

## 2024-03-30 DIAGNOSIS — Z0001 Encounter for general adult medical examination with abnormal findings: Secondary | ICD-10-CM | POA: Diagnosis not present

## 2024-03-30 DIAGNOSIS — Z0000 Encounter for general adult medical examination without abnormal findings: Secondary | ICD-10-CM

## 2024-03-30 DIAGNOSIS — F411 Generalized anxiety disorder: Secondary | ICD-10-CM | POA: Diagnosis not present

## 2024-03-30 DIAGNOSIS — B009 Herpesviral infection, unspecified: Secondary | ICD-10-CM

## 2024-03-30 DIAGNOSIS — I10 Essential (primary) hypertension: Secondary | ICD-10-CM | POA: Diagnosis not present

## 2024-03-30 DIAGNOSIS — F5101 Primary insomnia: Secondary | ICD-10-CM

## 2024-03-30 LAB — LIPID PANEL

## 2024-03-30 MED ORDER — METOPROLOL SUCCINATE ER 50 MG PO TB24: 50.0000 mg | ORAL_TABLET | Freq: Every day | ORAL | 4 refills | Status: AC

## 2024-03-30 MED ORDER — BUSPIRONE HCL 5 MG PO TABS: 5.0000 mg | ORAL_TABLET | Freq: Three times a day (TID) | ORAL | 4 refills | Status: AC | PRN

## 2024-03-30 MED ORDER — LOSARTAN POTASSIUM 100 MG PO TABS: ORAL_TABLET | ORAL | 4 refills | Status: AC

## 2024-03-30 MED ORDER — ATORVASTATIN CALCIUM 20 MG PO TABS: 20.0000 mg | ORAL_TABLET | Freq: Every day | ORAL | 4 refills | Status: AC

## 2024-03-30 MED ORDER — VALACYCLOVIR HCL 1 G PO TABS: 1000.0000 mg | ORAL_TABLET | Freq: Every day | ORAL | 4 refills | Status: AC

## 2024-03-30 MED ORDER — AMLODIPINE BESYLATE 5 MG PO TABS: 5.0000 mg | ORAL_TABLET | Freq: Every day | ORAL | 4 refills | Status: AC

## 2024-03-30 MED ORDER — HYDROCHLOROTHIAZIDE 12.5 MG PO CAPS: 12.5000 mg | ORAL_CAPSULE | Freq: Every day | ORAL | 4 refills | Status: AC

## 2024-03-30 NOTE — Patient Instructions (Signed)
 Health Maintenance, Male  Adopting a healthy lifestyle and getting preventive care are important in promoting health and wellness. Ask your health care provider about:  The right schedule for you to have regular tests and exams.  Things you can do on your own to prevent diseases and keep yourself healthy.  What should I know about diet, weight, and exercise?  Eat a healthy diet    Eat a diet that includes plenty of vegetables, fruits, low-fat dairy products, and lean protein.  Do not eat a lot of foods that are high in solid fats, added sugars, or sodium.  Maintain a healthy weight  Body mass index (BMI) is a measurement that can be used to identify possible weight problems. It estimates body fat based on height and weight. Your health care provider can help determine your BMI and help you achieve or maintain a healthy weight.  Get regular exercise  Get regular exercise. This is one of the most important things you can do for your health. Most adults should:  Exercise for at least 150 minutes each week. The exercise should increase your heart rate and make you sweat (moderate-intensity exercise).  Do strengthening exercises at least twice a week. This is in addition to the moderate-intensity exercise.  Spend less time sitting. Even light physical activity can be beneficial.  Watch cholesterol and blood lipids  Have your blood tested for lipids and cholesterol at 36 years of age, then have this test every 5 years.  You may need to have your cholesterol levels checked more often if:  Your lipid or cholesterol levels are high.  You are older than 36 years of age.  You are at high risk for heart disease.  What should I know about cancer screening?  Many types of cancers can be detected early and may often be prevented. Depending on your health history and family history, you may need to have cancer screening at various ages. This may include screening for:  Colorectal cancer.  Prostate cancer.  Skin cancer.  Lung  cancer.  What should I know about heart disease, diabetes, and high blood pressure?  Blood pressure and heart disease  High blood pressure causes heart disease and increases the risk of stroke. This is more likely to develop in people who have high blood pressure readings or are overweight.  Talk with your health care provider about your target blood pressure readings.  Have your blood pressure checked:  Every 3-5 years if you are 9-95 years of age.  Every year if you are 85 years old or older.  If you are between the ages of 29 and 29 and are a current or former smoker, ask your health care provider if you should have a one-time screening for abdominal aortic aneurysm (AAA).  Diabetes  Have regular diabetes screenings. This checks your fasting blood sugar level. Have the screening done:  Once every three years after age 23 if you are at a normal weight and have a low risk for diabetes.  More often and at a younger age if you are overweight or have a high risk for diabetes.  What should I know about preventing infection?  Hepatitis B  If you have a higher risk for hepatitis B, you should be screened for this virus. Talk with your health care provider to find out if you are at risk for hepatitis B infection.  Hepatitis C  Blood testing is recommended for:  Everyone born from [birth year] through 1963.  Anyone  with known risk factors for hepatitis C.  Sexually transmitted infections (STIs)  You should be screened each year for STIs, including gonorrhea and chlamydia, if:  You are sexually active and are younger than 36 years of age.  You are older than 36 years of age and your health care provider tells you that you are at risk for this type of infection.  Your sexual activity has changed since you were last screened, and you are at increased risk for chlamydia or gonorrhea. Ask your health care provider if you are at risk.  Ask your health care provider about whether you are at high risk for HIV. Your health care provider  may recommend a prescription medicine to help prevent HIV infection. If you choose to take medicine to prevent HIV, you should first get tested for HIV. You should then be tested every 3 months for as long as you are taking the medicine.  Follow these instructions at home:  Alcohol use  Do not drink alcohol if your health care provider tells you not to drink.  If you drink alcohol:  Limit how much you have to 0-2 drinks a day.  Know how much alcohol is in your drink. In the [place] one drink equals one 12 oz bottle of beer (355 mL), one 5 oz glass of wine (148 mL), or one 1 oz glass of hard liquor (44 mL).  Lifestyle  Do not use any products that contain nicotine or tobacco. These products include cigarettes, chewing tobacco, and vaping devices, such as e-cigarettes. If you need help quitting, ask your health care provider.  Do not use street drugs.  Do not share needles.  Ask your health care provider for help if you need support or information about quitting drugs.  General instructions  Schedule regular health, dental, and eye exams.  Stay current with your vaccines.  Tell your health care provider if:  You often feel depressed.  You have ever been abused or do not feel safe at home.  Summary  Adopting a healthy lifestyle and getting preventive care are important in promoting health and wellness.  Follow your health care provider's instructions about healthy diet, exercising, and getting tested or screened for diseases.  Follow your health care provider's instructions on monitoring your cholesterol and blood pressure.  This information is not intended to replace advice given to you by your health care provider. Make sure you discuss any questions you have with your health care provider.  Document Revised: 01/01/2021 Document Reviewed: 01/01/2021  Elsevier Patient Education  2024 [employer].

## 2024-03-30 NOTE — Progress Notes (Signed)
 Subjective:    Patient ID: Eugene Love, male    DOB: 11/05/1985, 37 y.o.   MRN: [id number]  Chief Complaint  Patient presents with   Annual Exam   PT presents to the office today for CPE and chronic follow up.   [name] with a BMI 38.   Has herpes and takes Valtrex  daily. States his last flare up was years ago.   Hypertension This is a chronic problem. The current episode started more than 1 year ago. The problem has been resolved since onset. The problem is controlled. Associated symptoms include anxiety and peripheral edema (some times). Pertinent negatives include no malaise/fatigue or shortness of breath. Risk factors for coronary artery disease include dyslipidemia, obesity and male gender. The current treatment provides moderate improvement.  Anxiety Presents for follow-up visit. Symptoms include excessive worry and nervous/anxious behavior. Patient reports no shortness of breath. Symptoms occur rarely. The severity of symptoms is mild.    Hyperlipidemia This is a chronic problem. The current episode started more than 1 year ago. The problem is controlled. Recent lipid tests were reviewed and are normal. Exacerbating diseases include obesity. Pertinent negatives include no shortness of breath. Current antihyperlipidemic treatment includes statins. The current treatment provides moderate improvement of lipids. Risk factors for coronary artery disease include dyslipidemia, male sex, hypertension and a sedentary lifestyle.      Review of Systems  Constitutional:  Negative for malaise/fatigue.  Respiratory:  Negative for shortness of breath.   Psychiatric/Behavioral:  The patient is nervous/anxious.   All other systems reviewed and are negative.  Family History  Problem Relation Age of Onset   Crohn's disease Father    Social History   Socioeconomic History   Marital status: Married    Spouse name: Scientist, research (medical)   Number of children: 3   Years of education: Not  on file   Highest education level: Not on file  Occupational History   Not on file  Tobacco Use   Smoking status: Never   Smokeless tobacco: Current    Types: Snuff   Tobacco comments:    chews tobacco  Vaping Use   Vaping status: Never Used  Substance and Sexual Activity   Alcohol use: Yes    Comment: 6 beers over the last month    Drug use: No   Sexual activity: Not on file  Other Topics Concern   Not on file  Social History Narrative   Not on file   Social Drivers of Health   Financial Resource Strain: Patient Declined (01/29/2023)   Overall Financial Resource Strain (CARDIA)    Difficulty of Paying Living Expenses: Patient declined  Food Insecurity: No Food Insecurity (03/30/2024)   Hunger Vital Sign    Worried About Running Out of Food in the Last Year: Never true    Ran Out of Food in the Last Year: Never true  Transportation Needs: No Transportation Needs (03/30/2024)   PRAPARE - Administrator, Civil Service (Medical): No    Lack of Transportation (Non-Medical): No  Physical Activity: Sufficiently Active (01/29/2023)   Exercise Vital Sign    Days of Exercise per Week: 5 days    Minutes of Exercise per Session: 30 min  Stress: No Stress Concern Present (01/29/2023)   [employer] of Occupational Health - Occupational Stress Questionnaire    Feeling of Stress : Not at all  Social Connections: Unknown (01/29/2023)   Social Connection and Isolation Panel    Frequency of Communication  with Friends and Family: Patient declined    Frequency of Social Gatherings with Friends and Family: Patient declined    Attends Religious Services: Patient declined    Database administrator or Organizations: Patient declined    Attends Banker Meetings: Not on file    Marital Status: Patient declined       Objective:   Physical Exam Vitals reviewed.  Constitutional:      General: He is not in acute distress.    Appearance: He is well-developed. He is obese.   HENT:     Head: Normocephalic.     Right Ear: Tympanic membrane normal.     Left Ear: Tympanic membrane normal.  Eyes:     General:        Right eye: No discharge.        Left eye: No discharge.     Pupils: Pupils are equal, round, and reactive to light.  Neck:     Thyroid: No thyromegaly.  Cardiovascular:     Rate and Rhythm: Normal rate and regular rhythm.     Heart sounds: Normal heart sounds. No murmur heard. Pulmonary:     Effort: Pulmonary effort is normal. No respiratory distress.     Breath sounds: Normal breath sounds. No wheezing.  Abdominal:     General: Bowel sounds are normal. There is no distension.     Palpations: Abdomen is soft.     Tenderness: There is no abdominal tenderness.  Musculoskeletal:        General: No tenderness. Normal range of motion.     Cervical back: Normal range of motion and neck supple.  Skin:    General: Skin is warm and dry.     Findings: No erythema or rash.  Neurological:     Mental Status: He is alert and oriented to person, place, and time.     Cranial Nerves: No cranial nerve deficit.     Deep Tendon Reflexes: Reflexes are normal and symmetric.  Psychiatric:        Behavior: Behavior normal.        Thought Content: Thought content normal.        Judgment: Judgment normal.          BP 125/88   Pulse 69   Temp 98.8 F (37.1 C)   Ht 5' 11 (1.803 m)   Wt 276 lb (125.2 kg)   SpO2 98%   BMI 38.49 kg/m   Assessment & Plan:  Eugene Love comes in today with chief complaint of Annual Exam   Diagnosis and orders addressed:  1. Annual physical exam (Primary) - CMP14+EGFR - CBC with Differential/Platelet - Lipid panel  2. Essential hypertension - amLODipine  (NORVASC ) 5 MG tablet; Take 1 tablet (5 mg total) by mouth daily.  Dispense: 90 tablet; Refill: 4 - hydrochlorothiazide  (MICROZIDE ) 12.5 MG capsule; Take 1 capsule (12.5 mg total) by mouth daily.  Dispense: 90 capsule; Refill: 4 - losartan  (COZAAR ) 100 MG tablet;  TAKE ONE (1) TABLET EACH DAY  Dispense: 90 tablet; Refill: 4 - metoprolol  succinate (TOPROL -XL) 50 MG 24 hr tablet; Take 1 tablet (50 mg total) by mouth daily. with food  Dispense: 90 tablet; Refill: 4 - CMP14+EGFR - CBC with Differential/Platelet  3. GAD (generalized anxiety disorder) - busPIRone  (BUSPAR ) 5 MG tablet; Take 1 tablet (5 mg total) by mouth 3 (three) times daily as needed.  Dispense: 90 tablet; Refill: 4 - CMP14+EGFR - CBC with Differential/Platelet  4. Hyperlipidemia, unspecified hyperlipidemia  type - atorvastatin  (LIPITOR) 20 MG tablet; Take 1 tablet (20 mg total) by mouth daily.  Dispense: 90 tablet; Refill: 4 - CMP14+EGFR - CBC with Differential/Platelet - Lipid panel  5. Herpes - valACYclovir  (VALTREX ) 1000 MG tablet; Take 1 tablet (1,000 mg total) by mouth daily.  Dispense: 90 tablet; Refill: 4 - CMP14+EGFR - CBC with Differential/Platelet  6. Primary insomnia - busPIRone  (BUSPAR ) 5 MG tablet; Take 1 tablet (5 mg total) by mouth 3 (three) times daily as needed.  Dispense: 90 tablet; Refill: 4 - CMP14+EGFR - CBC with Differential/Platelet    Labs pending Continue current medications  Health Maintenance reviewed Diet and exercise encouraged  Follow up plan: 6 months    Bari Learn, FNP

## 2024-03-31 LAB — CBC WITH DIFFERENTIAL/PLATELET
Basophils Absolute: 0 x10E3/uL (ref 0.0–0.2)
Basos: 1 %
EOS (ABSOLUTE): 0.2 x10E3/uL (ref 0.0–0.4)
Eos: 3 %
Hematocrit: 43 % (ref 37.5–51.0)
Hemoglobin: 14.2 g/dL (ref 13.0–17.7)
Immature Grans (Abs): 0 x10E3/uL (ref 0.0–0.1)
Immature Granulocytes: 0 %
Lymphocytes Absolute: 1.8 x10E3/uL (ref 0.7–3.1)
Lymphs: 30 %
MCH: 29.8 pg (ref 26.6–33.0)
MCHC: 33 g/dL (ref 31.5–35.7)
MCV: 90 fL (ref 79–97)
Monocytes Absolute: 0.4 x10E3/uL (ref 0.1–0.9)
Monocytes: 7 %
Neutrophils Absolute: 3.6 x10E3/uL (ref 1.4–7.0)
Neutrophils: 59 %
Platelets: 268 x10E3/uL (ref 150–450)
RBC: 4.77 x10E6/uL (ref 4.14–5.80)
RDW: 12.3 % (ref 11.6–15.4)
WBC: 6 x10E3/uL (ref 3.4–10.8)

## 2024-03-31 LAB — LIPID PANEL
Cholesterol, Total: 121 mg/dL (ref 100–199)
HDL: 42 mg/dL (ref >39)
LDL CALC COMMENT:: 2.9 ratio (ref 0.0–5.0)
LDL Chol Calc (NIH): 62 mg/dL (ref 0–99)
Triglycerides: 84 mg/dL (ref 0–149)
VLDL Cholesterol Cal: 17 mg/dL (ref 5–40)

## 2024-03-31 LAB — CMP14+EGFR
ALT: 47 IU/L — ABNORMAL HIGH (ref 0–44)
AST: 25 IU/L (ref 0–40)
Albumin: 4.3 g/dL (ref 4.1–5.1)
Alkaline Phosphatase: 74 IU/L (ref 44–121)
BUN/Creatinine Ratio: 12 (ref 9–20)
BUN: 11 mg/dL (ref 6–20)
Bilirubin Total: 0.5 mg/dL (ref 0.0–1.2)
CO2: 22 mmol/L (ref 20–29)
Calcium: 8.1 mg/dL — ABNORMAL LOW (ref 8.7–10.2)
Chloride: 103 mmol/L (ref 96–106)
Creatinine, Ser: 0.89 mg/dL (ref 0.76–1.27)
Globulin, Total: 2.6 g/dL (ref 1.5–4.5)
Glucose: 93 mg/dL (ref 70–99)
Potassium: 4.4 mmol/L (ref 3.5–5.2)
Sodium: 140 mmol/L (ref 134–144)
Total Protein: 6.9 g/dL (ref 6.0–8.5)
eGFR: 114 mL/min/1.73 (ref >59)

## 2024-04-01 ENCOUNTER — Ambulatory Visit: Payer: Self-pay | Admitting: Family

## 2024-09-14 ENCOUNTER — Encounter: Payer: Self-pay | Admitting: *Deleted

## 2024-10-01 ENCOUNTER — Ambulatory Visit: Payer: Self-pay | Admitting: Family

## 2024-10-11 ENCOUNTER — Ambulatory Visit: Payer: Self-pay
